# Patient Record
Sex: Female | Born: 1950 | Race: White | Hispanic: No | Marital: Married | Smoking: Never smoker
Health system: Southern US, Community
[De-identification: ages and names within clinical notes are randomized; demographics above are authoritative.]

## PROBLEM LIST (undated history)

## (undated) DIAGNOSIS — I10 Essential (primary) hypertension: Secondary | ICD-10-CM

## (undated) DIAGNOSIS — F419 Anxiety disorder, unspecified: Secondary | ICD-10-CM

## (undated) DIAGNOSIS — M1990 Unspecified osteoarthritis, unspecified site: Secondary | ICD-10-CM

## (undated) DIAGNOSIS — F329 Major depressive disorder, single episode, unspecified: Secondary | ICD-10-CM

## (undated) DIAGNOSIS — I6529 Occlusion and stenosis of unspecified carotid artery: Secondary | ICD-10-CM

## (undated) DIAGNOSIS — M6740 Ganglion, unspecified site: Secondary | ICD-10-CM

## (undated) DIAGNOSIS — Z8679 Personal history of other diseases of the circulatory system: Secondary | ICD-10-CM

## (undated) DIAGNOSIS — H269 Unspecified cataract: Secondary | ICD-10-CM

## (undated) DIAGNOSIS — R011 Cardiac murmur, unspecified: Secondary | ICD-10-CM

## (undated) DIAGNOSIS — E785 Hyperlipidemia, unspecified: Secondary | ICD-10-CM

## (undated) DIAGNOSIS — Q185 Microstomia: Secondary | ICD-10-CM

## (undated) DIAGNOSIS — F32A Depression, unspecified: Secondary | ICD-10-CM

## (undated) DIAGNOSIS — M19039 Primary osteoarthritis, unspecified wrist: Secondary | ICD-10-CM

## (undated) HISTORY — PX: BREAST BIOPSY: SHX20

## (undated) HISTORY — DX: Hyperlipidemia, unspecified: E78.5

## (undated) HISTORY — DX: Occlusion and stenosis of unspecified carotid artery: I65.29

## (undated) HISTORY — PX: ABDOMINAL HYSTERECTOMY: SHX81

---

## 2004-05-28 ENCOUNTER — Ambulatory Visit: Payer: Self-pay | Admitting: Unknown Physician Specialty

## 2005-03-19 ENCOUNTER — Ambulatory Visit: Payer: Self-pay | Admitting: General Practice

## 2006-03-23 ENCOUNTER — Ambulatory Visit: Payer: Self-pay | Admitting: Family Medicine

## 2007-03-29 ENCOUNTER — Ambulatory Visit: Payer: Self-pay | Admitting: Family Medicine

## 2007-04-01 ENCOUNTER — Ambulatory Visit: Payer: Self-pay | Admitting: Family Medicine

## 2007-04-12 ENCOUNTER — Other Ambulatory Visit: Payer: Self-pay

## 2007-04-12 ENCOUNTER — Ambulatory Visit: Payer: Self-pay | Admitting: Surgery

## 2007-04-15 ENCOUNTER — Ambulatory Visit: Payer: Self-pay | Admitting: Surgery

## 2008-04-11 ENCOUNTER — Ambulatory Visit: Payer: Self-pay | Admitting: Family Medicine

## 2009-04-16 ENCOUNTER — Ambulatory Visit: Payer: Self-pay | Admitting: Family Medicine

## 2009-08-02 ENCOUNTER — Ambulatory Visit: Payer: Self-pay | Admitting: Unknown Physician Specialty

## 2009-08-02 HISTORY — PX: COLONOSCOPY: SHX174

## 2009-11-18 ENCOUNTER — Other Ambulatory Visit: Admission: RE | Admit: 2009-11-18 | Discharge: 2009-11-18 | Payer: Self-pay | Admitting: Family Medicine

## 2010-03-11 ENCOUNTER — Ambulatory Visit (HOSPITAL_COMMUNITY): Admission: RE | Admit: 2010-03-11 | Discharge: 2010-03-12 | Payer: Self-pay | Admitting: Obstetrics and Gynecology

## 2010-03-11 ENCOUNTER — Encounter (INDEPENDENT_AMBULATORY_CARE_PROVIDER_SITE_OTHER): Payer: Self-pay | Admitting: Obstetrics and Gynecology

## 2010-03-11 HISTORY — PX: BLADDER SUSPENSION: SHX72

## 2010-03-11 HISTORY — PX: COLPORRHAPHY: SHX921

## 2010-03-11 HISTORY — PX: TOTAL VAGINAL HYSTERECTOMY: SHX2548

## 2010-03-11 HISTORY — PX: CYSTOSCOPY: SUR368

## 2010-04-23 ENCOUNTER — Ambulatory Visit: Payer: Self-pay

## 2010-10-03 LAB — BASIC METABOLIC PANEL
BUN: 11 mg/dL (ref 6–23)
CO2: 27 mEq/L (ref 19–32)
CO2: 28 mEq/L (ref 19–32)
Calcium: 8.4 mg/dL (ref 8.4–10.5)
Chloride: 104 mEq/L (ref 96–112)
Chloride: 98 mEq/L (ref 96–112)
Creatinine, Ser: 0.65 mg/dL (ref 0.4–1.2)
Creatinine, Ser: 0.78 mg/dL (ref 0.4–1.2)
GFR calc Af Amer: >60 mL/min (ref >60)
Glucose, Bld: 96 mg/dL (ref 70–99)
Glucose, Bld: 99 mg/dL (ref 70–99)
Potassium: 3.9 mEq/L (ref 3.5–5.1)

## 2010-10-03 LAB — CBC
HCT: 35.6 % — ABNORMAL LOW (ref 36.0–46.0)
Hemoglobin: 10.3 g/dL — ABNORMAL LOW (ref 12.0–15.0)
MCH: 32.4 pg (ref 26.0–34.0)
MCH: 32.7 pg (ref 26.0–34.0)
MCHC: 34.7 g/dL (ref 30.0–36.0)
MCHC: 34.9 g/dL (ref 30.0–36.0)
MCV: 93.5 fL (ref 78.0–100.0)
MCV: 93.7 fL (ref 78.0–100.0)
Platelets: 190 10*3/uL (ref 150–400)
Platelets: 233 10*3/uL (ref 150–400)
RBC: 3.16 MIL/uL — ABNORMAL LOW (ref 3.87–5.11)
RDW: 12.3 % (ref 11.5–15.5)

## 2011-06-01 ENCOUNTER — Ambulatory Visit: Payer: Self-pay

## 2011-06-04 ENCOUNTER — Ambulatory Visit: Payer: Self-pay

## 2011-07-29 ENCOUNTER — Ambulatory Visit: Payer: Self-pay

## 2012-06-07 ENCOUNTER — Ambulatory Visit: Payer: Self-pay | Admitting: Family Medicine

## 2012-06-10 ENCOUNTER — Ambulatory Visit: Payer: Self-pay | Admitting: Family Medicine

## 2013-01-17 DIAGNOSIS — M6740 Ganglion, unspecified site: Secondary | ICD-10-CM

## 2013-01-17 DIAGNOSIS — M19039 Primary osteoarthritis, unspecified wrist: Secondary | ICD-10-CM

## 2013-01-17 HISTORY — DX: Primary osteoarthritis, unspecified wrist: M19.039

## 2013-01-17 HISTORY — DX: Ganglion, unspecified site: M67.40

## 2013-01-31 ENCOUNTER — Other Ambulatory Visit: Payer: Self-pay | Admitting: Orthopedic Surgery

## 2013-02-07 ENCOUNTER — Encounter (HOSPITAL_BASED_OUTPATIENT_CLINIC_OR_DEPARTMENT_OTHER): Payer: Self-pay | Admitting: *Deleted

## 2013-02-07 NOTE — Pre-Procedure Instructions (Addendum)
To come for BMET and EKG Office note, any cardiac testing and EKG req. from [place] at [facility]

## 2013-02-10 ENCOUNTER — Encounter (HOSPITAL_BASED_OUTPATIENT_CLINIC_OR_DEPARTMENT_OTHER)
Admission: RE | Admit: 2013-02-10 | Discharge: 2013-02-10 | Disposition: A | Discharge status: Home / Self Care | Payer: BC Managed Care – PPO | Source: Ambulatory Visit | Attending: Orthopedic Surgery | Admitting: Orthopedic Surgery

## 2013-02-10 LAB — BASIC METABOLIC PANEL
CO2: 24 mEq/L (ref 19–32)
Calcium: 9.4 mg/dL (ref 8.4–10.5)
Creatinine, Ser: 0.8 mg/dL (ref 0.50–1.10)
GFR calc non Af Amer: 77 mL/min — ABNORMAL LOW (ref >90)
Glucose, Bld: 85 mg/dL (ref 70–99)
Sodium: 133 mEq/L — ABNORMAL LOW (ref 135–145)

## 2013-02-13 NOTE — H&P (Signed)
  Christina [name] is an 62 y.o. female.   Chief Complaint: c/o chronic and progressive pain ulnar aspect left wrist HPI: .  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  She has had increasing pain on the ulnar aspect of her right wrist, volar aspect, for the past three years.  She has no antecedent history of injury.   Past Medical History  Diagnosis Date  . Arthritis     hands/fingers  . Abnormally small mouth   . Depression   . Hypertension     started med. 01/26/2013  . History of rheumatic fever     as a teenager  . Heart murmur     due to rheumatic fever as a teenager; states no known problems  . 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 Mucoid cyst of joint 01/2013    right middle finger  . Cataract, immature     Past Surgical History  Procedure Laterality Date  . Total vaginal hysterectomy  03/11/2010  . Colporrhaphy  03/11/2010    posterior  . Bladder suspension  03/11/2010    Solyx sling  . Cystoscopy  03/11/2010  . Hammer toe surgery Left   . Breast biopsy Left     x 2 - both benign    History reviewed. No pertinent family history. Social History:  reports that she has never smoked. She has never used smokeless tobacco. She reports that she does not drink alcohol or use illicit drugs.  Allergies:  Allergies  Allergen Reactions  . Sulfa Antibiotics Hives    No prescriptions prior to admission    No results found for this or any previous visit (from the past 48 hour(s)).  No results found.   Pertinent items are noted in HPI.  Height 5' (1.524 m), weight 57.607 kg (127 lb).  General appearance: alert Head: Normocephalic, without obvious abnormality Neck: supple, symmetrical, trachea midline Resp: clear to auscultation bilaterally Cardio: regular rate and rhythm GI: normal findings: bowel sounds normal Extremities:.  Inspection of her wrist reveals swelling on the volar inner aspect of her  left wrist.  She is tender on palpation over the pisiform. She has pain with pisotriquetral grind.  She has full range of motion of her fingers in flexion at the MP and IP joints.  She has mild shouldering of her left thumb CMC joint, but no radiographic evidence of arthrosis.   With respect to her right long finger she has a small mucoid cyst that is soft and easily decompressed by direct pressure.  She has full range of motion of her IP joints.  She does not have significant Heberden's and Bouchard's nodes.   X-rays of her left wrist, four views, demonstrate advanced pisotriquetral arthrosis with bone-on-bone arthropathy.  Her carpus is otherwise well preserved.  Her thumb carpometacarpal joint is normal in appearance.     Pulses: 2+ and symmetric Skin: normal Neurologic: Grossly normal    Assessment/Plan Impression:  End stage pisotriquetral arthrosis left wrist  Plan: To the OR for excision pisiform left wrist.The procedure, risks,benefits and post-op course were discussed with the patient at length and they were in agreement with the plan.    DASNOIT,Nyna Chilton J 02/13/2013, 1:46 PM   H&P documentation: 02/14/2013  -History and Physical Reviewed  -Patient has been re-examined  -No change in the plan of care  Wyn Forster, MD

## 2013-02-14 ENCOUNTER — Encounter (HOSPITAL_BASED_OUTPATIENT_CLINIC_OR_DEPARTMENT_OTHER): Payer: Self-pay | Admitting: Orthopedic Surgery

## 2013-02-14 ENCOUNTER — Ambulatory Visit (HOSPITAL_BASED_OUTPATIENT_CLINIC_OR_DEPARTMENT_OTHER)
Admission: RE | Admit: 2013-02-14 | Discharge: 2013-02-14 | Disposition: A | Discharge status: Home / Self Care | Payer: BC Managed Care – PPO | Source: Ambulatory Visit | Attending: Orthopedic Surgery | Admitting: Orthopedic Surgery

## 2013-02-14 ENCOUNTER — Encounter (HOSPITAL_BASED_OUTPATIENT_CLINIC_OR_DEPARTMENT_OTHER)
Admission: RE | Disposition: A | Discharge status: Home / Self Care | Payer: Self-pay | Source: Ambulatory Visit | Attending: Orthopedic Surgery

## 2013-02-14 ENCOUNTER — Ambulatory Visit (HOSPITAL_BASED_OUTPATIENT_CLINIC_OR_DEPARTMENT_OTHER): Payer: BC Managed Care – PPO | Admitting: Anesthesiology

## 2013-02-14 ENCOUNTER — Encounter (HOSPITAL_BASED_OUTPATIENT_CLINIC_OR_DEPARTMENT_OTHER): Payer: Self-pay | Admitting: Anesthesiology

## 2013-02-14 DIAGNOSIS — Z87898 Personal history of other specified conditions: Secondary | ICD-10-CM | POA: Insufficient documentation

## 2013-02-14 DIAGNOSIS — F329 Major depressive disorder, single episode, unspecified: Secondary | ICD-10-CM | POA: Insufficient documentation

## 2013-02-14 DIAGNOSIS — Z882 Allergy status to sulfonamides status: Secondary | ICD-10-CM | POA: Insufficient documentation

## 2013-02-14 DIAGNOSIS — M19049 Primary osteoarthritis, unspecified hand: Secondary | ICD-10-CM | POA: Insufficient documentation

## 2013-02-14 DIAGNOSIS — I10 Essential (primary) hypertension: Secondary | ICD-10-CM | POA: Insufficient documentation

## 2013-02-14 DIAGNOSIS — R011 Cardiac murmur, unspecified: Secondary | ICD-10-CM | POA: Insufficient documentation

## 2013-02-14 DIAGNOSIS — D2110 Benign neoplasm of connective and other soft tissue of unspecified upper limb, including shoulder: Secondary | ICD-10-CM | POA: Insufficient documentation

## 2013-02-14 DIAGNOSIS — F3289 Other specified depressive episodes: Secondary | ICD-10-CM | POA: Insufficient documentation

## 2013-02-14 HISTORY — DX: Depression, unspecified: F32.A

## 2013-02-14 HISTORY — DX: Cardiac murmur, unspecified: R01.1

## 2013-02-14 HISTORY — DX: Major depressive disorder, single episode, unspecified: F32.9

## 2013-02-14 HISTORY — DX: Personal history of other diseases of the circulatory system: Z86.79

## 2013-02-14 HISTORY — PX: EXCISION METACARPAL MASS: SHX6372

## 2013-02-14 HISTORY — PX: STERIOD INJECTION: SHX5046

## 2013-02-14 HISTORY — DX: Essential (primary) hypertension: I10

## 2013-02-14 HISTORY — DX: Ganglion, unspecified site: M67.40

## 2013-02-14 HISTORY — DX: Primary osteoarthritis, unspecified wrist: M19.039

## 2013-02-14 HISTORY — DX: Unspecified osteoarthritis, unspecified site: M19.90

## 2013-02-14 HISTORY — DX: Unspecified cataract: H26.9

## 2013-02-14 HISTORY — DX: Microstomia: Q18.5

## 2013-02-14 SURGERY — EXCISION METACARPAL MASS
Anesthesia: General | Site: Wrist | Laterality: Right | Wound class: Clean

## 2013-02-14 MED ORDER — DEXAMETHASONE SODIUM PHOSPHATE 4 MG/ML IJ SOLN
INTRAMUSCULAR | Status: DC | PRN
  Administered 2013-02-14: 10 mg via INTRAVENOUS

## 2013-02-14 MED ORDER — ONDANSETRON HCL 4 MG/2ML IJ SOLN
INTRAMUSCULAR | Status: DC | PRN
  Administered 2013-02-14: 4 mg via INTRAVENOUS

## 2013-02-14 MED ORDER — LIDOCAINE HCL (CARDIAC) 20 MG/ML IV SOLN
INTRAVENOUS | Status: DC | PRN
  Administered 2013-02-14: 40 mg via INTRAVENOUS

## 2013-02-14 MED ORDER — PROPOFOL 10 MG/ML IV BOLUS
INTRAVENOUS | Status: DC | PRN
  Administered 2013-02-14: 150 mg via INTRAVENOUS

## 2013-02-14 MED ORDER — HYDROMORPHONE HCL 2 MG PO TABS: 2.0000 mg | ORAL_TABLET | ORAL | Status: DC | PRN

## 2013-02-14 MED ORDER — OXYCODONE HCL 5 MG PO TABS
5.0000 mg | ORAL_TABLET | Freq: Once | ORAL | Status: AC | PRN
  Administered 2013-02-14: 5 mg via ORAL

## 2013-02-14 MED ORDER — OXYCODONE HCL 5 MG/5ML PO SOLN: 5.0000 mg | Freq: Once | ORAL | Status: AC | PRN

## 2013-02-14 MED ORDER — LACTATED RINGERS IV SOLN
INTRAVENOUS | Status: DC
  Administered 2013-02-14: 10:00:00 via INTRAVENOUS

## 2013-02-14 MED ORDER — CHLORHEXIDINE GLUCONATE 4 % EX LIQD: 60.0000 mL | Freq: Once | CUTANEOUS | Status: DC

## 2013-02-14 MED ORDER — MIDAZOLAM HCL 2 MG/2ML IJ SOLN: 1.0000 mg | INTRAMUSCULAR | Status: DC | PRN

## 2013-02-14 MED ORDER — CEPHALEXIN 500 MG PO CAPS: 500.0000 mg | ORAL_CAPSULE | Freq: Three times a day (TID) | ORAL | Status: DC

## 2013-02-14 MED ORDER — MIDAZOLAM HCL 2 MG/ML PO SYRP: 12.0000 mg | ORAL_SOLUTION | Freq: Once | ORAL | Status: DC | PRN

## 2013-02-14 MED ORDER — METHYLPREDNISOLONE ACETATE 40 MG/ML IJ SUSP
INTRAMUSCULAR | Status: DC | PRN
  Administered 2013-02-14: 20 mg via INTRA_ARTICULAR

## 2013-02-14 MED ORDER — HYDROMORPHONE HCL PF 1 MG/ML IJ SOLN
0.2500 mg | INTRAMUSCULAR | Status: DC | PRN
  Administered 2013-02-14 (×2): 0.5 mg via INTRAVENOUS

## 2013-02-14 MED ORDER — FENTANYL CITRATE 0.05 MG/ML IJ SOLN: 50.0000 ug | INTRAMUSCULAR | Status: DC | PRN

## 2013-02-14 MED ORDER — FENTANYL CITRATE 0.05 MG/ML IJ SOLN
INTRAMUSCULAR | Status: DC | PRN
  Administered 2013-02-14 (×3): 25 ug via INTRAVENOUS

## 2013-02-14 SURGICAL SUPPLY — 57 items
BAG DECANTER FOR FLEXI CONT (MISCELLANEOUS) ×3 IMPLANT
BANDAGE ADHESIVE 1X3 (GAUZE/BANDAGES/DRESSINGS) ×3 IMPLANT
BANDAGE ELASTIC 3 VELCRO ST LF (GAUZE/BANDAGES/DRESSINGS) ×3 IMPLANT
BANDAGE ELASTIC 4 VELCRO ST LF (GAUZE/BANDAGES/DRESSINGS) ×3 IMPLANT
BANDAGE GAUZE ELAST BULKY 4 IN (GAUZE/BANDAGES/DRESSINGS) ×6 IMPLANT
BLADE MINI RND TIP GREEN BEAV (BLADE) ×3 IMPLANT
BLADE SURG 15 STRL LF DISP TIS (BLADE) ×2 IMPLANT
BLADE SURG 15 STRL SS (BLADE) ×1
BNDG ESMARK 4X9 LF (GAUZE/BANDAGES/DRESSINGS) ×3 IMPLANT
BRUSH SCRUB EZ PLAIN DRY (MISCELLANEOUS) ×3 IMPLANT
CANISTER SUCTION 1200CC (MISCELLANEOUS) IMPLANT
CLOTH BEACON ORANGE TIMEOUT ST (SAFETY) ×3 IMPLANT
CORDS BIPOLAR (ELECTRODE) ×3 IMPLANT
COVER MAYO STAND STRL (DRAPES) ×3 IMPLANT
COVER TABLE BACK 60X90 (DRAPES) ×3 IMPLANT
CUFF TOURNIQUET SINGLE 18IN (TOURNIQUET CUFF) ×3 IMPLANT
DECANTER SPIKE VIAL GLASS SM (MISCELLANEOUS) IMPLANT
DRAPE EXTREMITY T 121X128X90 (DRAPE) ×3 IMPLANT
DRAPE OEC MINIVIEW 54X84 (DRAPES) IMPLANT
DRAPE SURG 17X23 STRL (DRAPES) ×3 IMPLANT
GAUZE XEROFORM 1X8 LF (GAUZE/BANDAGES/DRESSINGS) ×3 IMPLANT
GLOVE BIO SURGEON STRL SZ 6.5 (GLOVE) ×3 IMPLANT
GLOVE BIOGEL M STRL SZ7.5 (GLOVE) IMPLANT
GLOVE BIOGEL PI IND STRL 7.0 (GLOVE) ×4 IMPLANT
GLOVE BIOGEL PI INDICATOR 7.0 (GLOVE) ×2
GLOVE ORTHO TXT STRL SZ7.5 (GLOVE) ×3 IMPLANT
GOWN BRE IMP PREV XXLGXLNG (GOWN DISPOSABLE) ×3 IMPLANT
GOWN PREVENTION PLUS XLARGE (GOWN DISPOSABLE) ×3 IMPLANT
LOOP VESSEL MAXI BLUE (MISCELLANEOUS) IMPLANT
NEEDLE 27GAX1X1/2 (NEEDLE) ×3 IMPLANT
NS IRRIG 1000ML POUR BTL (IV SOLUTION) ×3 IMPLANT
PACK BASIN DAY SURGERY FS (CUSTOM PROCEDURE TRAY) ×3 IMPLANT
PAD ALCOHOL SWAB (MISCELLANEOUS) ×3 IMPLANT
PAD CAST 3X4 CTTN HI CHSV (CAST SUPPLIES) ×4 IMPLANT
PADDING CAST ABS 4INX4YD NS (CAST SUPPLIES) ×1
PADDING CAST ABS COTTON 4X4 ST (CAST SUPPLIES) ×2 IMPLANT
PADDING CAST COTTON 3X4 STRL (CAST SUPPLIES) ×2
SLEEVE SCD COMPRESS KNEE MED (MISCELLANEOUS) ×3 IMPLANT
SPLINT PLASTER CAST XFAST 3X15 (CAST SUPPLIES) ×2 IMPLANT
SPLINT PLASTER XTRA FASTSET 3X (CAST SUPPLIES) ×1
SPONGE GAUZE 4X4 12PLY (GAUZE/BANDAGES/DRESSINGS) ×3 IMPLANT
STOCKINETTE 4X48 STRL (DRAPES) ×3 IMPLANT
STRIP CLOSURE SKIN 1/2X4 (GAUZE/BANDAGES/DRESSINGS) ×3 IMPLANT
SUCTION FRAZIER TIP 10 FR DISP (SUCTIONS) IMPLANT
SUT ETHIBOND 3-0 V-5 (SUTURE) IMPLANT
SUT PROLENE 3 0 PS 2 (SUTURE) ×3 IMPLANT
SUT VIC AB 2-0 PS2 27 (SUTURE) IMPLANT
SUT VIC AB 3-0 FS2 27 (SUTURE) IMPLANT
SWABSTICK POVIDONE IODINE SNGL (MISCELLANEOUS) ×3 IMPLANT
SYR 3ML 23GX1 SAFETY (SYRINGE) ×3 IMPLANT
SYR 3ML LL SCALE MARK (SYRINGE) ×3 IMPLANT
SYR BULB 3OZ (MISCELLANEOUS) ×3 IMPLANT
SYR CONTROL 10ML LL (SYRINGE) ×3 IMPLANT
TOWEL OR 17X24 6PK STRL BLUE (TOWEL DISPOSABLE) ×3 IMPLANT
TRAY DSU PREP LF (CUSTOM PROCEDURE TRAY) ×3 IMPLANT
TUBE CONNECTING 20X1/4 (TUBING) IMPLANT
UNDERPAD 30X30 INCONTINENT (UNDERPADS AND DIAPERS) ×3 IMPLANT

## 2013-02-14 NOTE — Anesthesia Procedure Notes (Signed)
Procedure Name: LMA Insertion Date/Time: 02/14/2013 10:29 AM Performed by: Gar Gibbon Pre-anesthesia Checklist: Patient identified, Emergency Drugs available, Suction available and Patient being monitored Patient Re-evaluated:Patient Re-evaluated prior to inductionOxygen Delivery Method: Circle System Utilized Preoxygenation: Pre-oxygenation with 100% oxygen Intubation Type: IV induction Ventilation: Mask ventilation without difficulty LMA: LMA inserted LMA Size: 3.0 Number of attempts: 1 Airway Equipment and Method: bite block Placement Confirmation: positive ETCO2 Tube secured with: Tape Dental Injury: Teeth and Oropharynx as per pre-operative assessment  Comments: #4 LMA placed, unable to seal. Replaced with #3

## 2013-02-14 NOTE — Op Note (Signed)
[id number] 

## 2013-02-14 NOTE — Transfer of Care (Signed)
Immediate Anesthesia Transfer of Care Note  Patient: Christina [name]  Procedure(s) Performed: Procedure(s): LEFT EXCISE PISIFORM (Left) INJECT RIGHT LONG DIP WITH CORTISONE (Right)  Patient Location: PACU  Anesthesia Type:General  Level of Consciousness: awake, sedated and patient cooperative  Airway & Oxygen Therapy: Patient Spontanous Breathing and Patient connected to face mask oxygen  Post-op Assessment: Report given to PACU RN and Post -op Vital signs reviewed and stable  Post vital signs: Reviewed and stable  Complications: No apparent anesthesia complications

## 2013-02-14 NOTE — Anesthesia Postprocedure Evaluation (Signed)
  Anesthesia Post-op Note  Patient: Christina [name]  Procedure(s) Performed: Procedure(s): LEFT EXCISE PISIFORM (Left) INJECT RIGHT LONG DIP WITH CORTISONE (Right)  Patient Location: PACU  Anesthesia Type:General  Level of Consciousness: awake, alert  and oriented  Airway and Oxygen Therapy: Patient Spontanous Breathing  Post-op Pain: mild  Post-op Assessment: Post-op Vital signs reviewed, [name] Cardiovascular Status Stable, Respiratory Function Stable, Patent Airway and No signs of Nausea or vomiting  Post-op Vital Signs: Reviewed and stable  Complications: No apparent anesthesia complications

## 2013-02-14 NOTE — Brief Op Note (Signed)
02/14/2013  11:39 AM  PATIENT:  Christina [name]  62 y.o. female  PRE-OPERATIVE DIAGNOSIS:  LEFT PISOTRIQUETRAL DJD, RIGHT LONG MUCOID CYST  POST-OPERATIVE DIAGNOSIS:  left pisotriquetral djd, right long mucoid cyst  PROCEDURE:  Procedure(s): LEFT EXCISE PISIFORM (Left) INJECT RIGHT LONG DIP WITH CORTISONE (Right)  SURGEON:  Surgeon(s) and Role:    * Wyn Forster., MD - Primary  PHYSICIAN ASSISTANT:   ASSISTANTS: Surgical technician  ANESTHESIA:   general  EBL:  Total I/O In: 450 [I.V.:450] Out: -   BLOOD ADMINISTERED:none  DRAINS: none   LOCAL MEDICATIONS USED:  XYLOCAINE   SPECIMEN:  No Specimen  DISPOSITION OF SPECIMEN:  N/A  COUNTS:  YES  TOURNIQUET:   Total Tourniquet Time Documented: Upper Arm (Left) - 31 minutes Total: Upper Arm (Left) - 31 minutes   DICTATION: .Other Dictation: Dictation Number [id number]  PLAN OF CARE: Discharge to home after PACU  PATIENT DISPOSITION:  PACU - hemodynamically stable.   Delay start of Pharmacological VTE agent (>24hrs) due to surgical blood loss or risk of bleeding: not applicable

## 2013-02-14 NOTE — Anesthesia Preprocedure Evaluation (Addendum)
Anesthesia Evaluation  Patient identified by MRN, date of birth, ID band Patient awake    Reviewed: Allergy & Precautions, H&P , NPO status , [name] Chart, lab work & pertinent test results  Airway Mallampati: II TM Distance: >3 FB Neck ROM: Full    Dental no notable dental hx. (+) Teeth Intact and Dental Advisory Given   Pulmonary neg pulmonary ROS,  breath sounds clear to auscultation  Pulmonary exam normal       Cardiovascular hypertension, On Medications + Valvular Problems/Murmurs Rhythm:Regular Rate:Normal     Neuro/Psych PSYCHIATRIC DISORDERS negative neurological ROS     GI/Hepatic negative GI ROS, Neg liver ROS,   Endo/Other  negative endocrine ROS  Renal/GU negative Renal ROS  negative genitourinary   Musculoskeletal   Abdominal   Peds  Hematology negative hematology ROS (+)   Anesthesia Other Findings   Reproductive/Obstetrics negative OB ROS                          Anesthesia Physical Anesthesia Plan  ASA: II  Anesthesia Plan: General   Post-op Pain Management:    Induction: Intravenous  Airway Management Planned: LMA  Additional Equipment:   Intra-op Plan:   Post-operative Plan: Extubation in OR  Informed Consent: I have reviewed the patients History and Physical, chart, labs and discussed the procedure including the risks, benefits and alternatives for the proposed anesthesia with the patient or authorized representative who has indicated his/her understanding and acceptance.   Dental advisory given  Plan Discussed with: CRNA  Anesthesia Plan Comments:         Anesthesia Quick Evaluation

## 2013-02-15 ENCOUNTER — Encounter (HOSPITAL_BASED_OUTPATIENT_CLINIC_OR_DEPARTMENT_OTHER): Payer: Self-pay | Admitting: Orthopedic Surgery

## 2013-02-15 NOTE — Op Note (Signed)
NAME:  Christina [name], Christina [name]               ACCOUNT NO.:  [id number]  MEDICAL RECORD NO.:  [id number]  LOCATION:                               FACILITY:  [facility]  PHYSICIAN:  Christina Fitch. Samya [name], M.D. DATE OF BIRTH:  Oct 22, 1950  DATE OF PROCEDURE:  02/14/2013 DATE OF DISCHARGE:  02/14/2013                              OPERATIVE REPORT   PREOPERATIVE DIAGNOSIS:  Painful pisotriquetral arthritis, left wrist. Also mucoid cyst, dorsal radial aspect right long finger.  POSTOPERATIVE DIAGNOSIS:  Painful pisotriquetral arthritis, left wrist. Also mucoid cyst, dorsal radial aspect right long finger.  PROCEDURE: 1. Resection of left pisiform and repair of flexor carpi ulnaris     tendon. 2. Injection of Depo-Medrol and 2% lidocaine into dorsal aspect of     right long finger distal interphalangeal joint.  SURGEON:  Christina Fitch. Amiya [name], M.D.  ASSISTANT:  Surgical technician.  ANESTHESIA:  General by LMA.  SUPERVISING ANESTHESIOLOGIST:  Christina Mayo, MD.  INDICATIONS:  Christina [name] is a 62 year old woman referred through the courtesy of Dr. Lupita [name], for evaluation and management of a painful left wrist.  Clinical examination revealed signs of advanced pisotriquetral arthrosis with marked deformity of the pisiform and bone- on-bone arthropathy.  She had a large osteophyte on the triquetrum.  She also had a mucoid cyst on the dorsal radial aspect of her right long finger at distal interphalangeal joint.  This had previously drained. We advised her that we might be able to help this resolve by an intra- articular steroid injection while she was under anesthesia.  Christina [name] requested that we treat the right long finger during her anesthesia for her left wrist pain predicament.  After informed consent, she was brought to the operating room at this time.  Preoperatively, she was interviewed by Dr. Sampson [name] who recommended general anesthesia by LMA technique.  DESCRIPTION OF PROCEDURE:   Christina [name] was brought to room 2 of the [name] and placed in supine position on the operating table.  Following the induction of general anesthesia by LMA technique, the left arm was prepped with Betadine soap and solution, sterilely draped.  A 2 g of Ancef was administered as an IV prophylactic antibiotic.  During the induction of anesthesia when Christina [name] was anesthetic, we prepped the right long finger with Betadine waited a few moments and then injected 0.5 mL of 50:50 mixture of Depo-Medrol 40 mg/mL and 2% plain lidocaine.  Excellent joint distention was achieved.  This was then dressed with a Band-Aid.  The left hand and arm were then exsanguinated with an Esmarch bandage and the arterial tourniquet on the proximal brachium inflated to 250 mmHg.  Curvilinear incision was fashioned between the glabrous skin and the dorsal skin on the ulnar aspect of the hand.  Subcutaneous tissues were carefully divided identifying the hypothenar muscles.  These were elevated off the distal pisiform, and the flexor carpi ulnaris was split on its ulnar aspect.  We shelled out the pisiform with great care using a towel clip to apply traction.  Care was taken to identify and protect the ulnar nerve throughout dissection.  There was a large shelf of reactive bone formation  on the triquetrum. The pisiform was removed en bloc.  This was placed in the cup to be provided to Christina [name] as a Scientist, forensic of surgery.  The pisotriquetral joint was inspected and synovectomy accomplished.  A very sizable rim of reactive osteophyte on the triquetrum was excised with a rongeur.  The flexor carpi ulnaris was repaired with figure-of-eight sutures of 3- 0 FiberWire knots inverted and the skin repaired with subcutaneous 4-0 Vicryl and intradermal 3-0 Prolene.  Christina [name] was placed in compressive dressing with a volar plaster splint maintaining the wrist in 10 degrees of dorsiflexion.  There were  no apparent complications.     Christina Fitch Aundra Espin, M.D.     RVS/MEDQ  D:  02/14/2013  T:  02/15/2013  Job:  [id number]

## 2013-06-13 ENCOUNTER — Ambulatory Visit: Payer: Self-pay | Admitting: Family Medicine

## 2014-08-09 ENCOUNTER — Ambulatory Visit: Payer: Self-pay | Admitting: Family Medicine

## 2014-08-09 DIAGNOSIS — Z1231 Encounter for screening mammogram for malignant neoplasm of breast: Secondary | ICD-10-CM | POA: Diagnosis not present

## 2014-09-15 DIAGNOSIS — Z0000 Encounter for general adult medical examination without abnormal findings: Secondary | ICD-10-CM | POA: Diagnosis not present

## 2014-10-18 ENCOUNTER — Other Ambulatory Visit: Payer: Self-pay | Admitting: Family Medicine

## 2014-10-18 DIAGNOSIS — R9389 Abnormal findings on diagnostic imaging of other specified body structures: Secondary | ICD-10-CM

## 2014-10-25 ENCOUNTER — Ambulatory Visit
Admission: RE | Admit: 2014-10-25 | Discharge: 2014-10-25 | Disposition: A | Discharge status: Home / Self Care | Payer: Medicare Other | Source: Ambulatory Visit | Attending: Family Medicine | Admitting: Family Medicine

## 2014-10-25 DIAGNOSIS — R9389 Abnormal findings on diagnostic imaging of other specified body structures: Secondary | ICD-10-CM

## 2014-10-25 DIAGNOSIS — I6523 Occlusion and stenosis of bilateral carotid arteries: Secondary | ICD-10-CM | POA: Diagnosis not present

## 2014-12-26 DIAGNOSIS — G479 Sleep disorder, unspecified: Secondary | ICD-10-CM | POA: Diagnosis not present

## 2014-12-26 DIAGNOSIS — F430 Acute stress reaction: Secondary | ICD-10-CM | POA: Diagnosis not present

## 2014-12-26 DIAGNOSIS — Z711 Person with feared health complaint in whom no diagnosis is made: Secondary | ICD-10-CM | POA: Diagnosis not present

## 2014-12-26 DIAGNOSIS — R32 Unspecified urinary incontinence: Secondary | ICD-10-CM | POA: Diagnosis not present

## 2015-01-03 ENCOUNTER — Encounter: Payer: Self-pay | Admitting: Neurology

## 2015-01-03 ENCOUNTER — Ambulatory Visit (INDEPENDENT_AMBULATORY_CARE_PROVIDER_SITE_OTHER): Payer: Medicare Other | Admitting: Neurology

## 2015-01-03 DIAGNOSIS — G4700 Insomnia, unspecified: Secondary | ICD-10-CM | POA: Diagnosis not present

## 2015-01-03 NOTE — Patient Instructions (Signed)
1. If you are interested in the driving assessment, you can contact [employer] in [place]. [phone]. 2. After this is complete, please let us know if you would like a referral for a neuropsych evaluation.
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
## 2015-01-03 NOTE — Progress Notes (Signed)
Note routed to Dr Arville Care.

## 2015-04-24 DIAGNOSIS — Z23 Encounter for immunization: Secondary | ICD-10-CM | POA: Diagnosis not present

## 2015-05-15 DIAGNOSIS — Z23 Encounter for immunization: Secondary | ICD-10-CM | POA: Diagnosis not present

## 2015-05-15 DIAGNOSIS — F322 Major depressive disorder, single episode, severe without psychotic features: Secondary | ICD-10-CM | POA: Diagnosis not present

## 2015-05-15 DIAGNOSIS — Z1211 Encounter for screening for malignant neoplasm of colon: Secondary | ICD-10-CM | POA: Diagnosis not present

## 2015-05-15 DIAGNOSIS — Z01419 Encounter for gynecological examination (general) (routine) without abnormal findings: Secondary | ICD-10-CM | POA: Diagnosis not present

## 2015-05-15 DIAGNOSIS — E7800 Pure hypercholesterolemia, unspecified: Secondary | ICD-10-CM | POA: Diagnosis not present

## 2015-05-15 DIAGNOSIS — I10 Essential (primary) hypertension: Secondary | ICD-10-CM | POA: Diagnosis not present

## 2015-05-15 DIAGNOSIS — Z0000 Encounter for general adult medical examination without abnormal findings: Secondary | ICD-10-CM | POA: Diagnosis not present

## 2015-05-16 ENCOUNTER — Other Ambulatory Visit: Payer: Self-pay | Admitting: Internal Medicine

## 2015-05-16 DIAGNOSIS — Z1239 Encounter for other screening for malignant neoplasm of breast: Secondary | ICD-10-CM

## 2015-05-17 ENCOUNTER — Other Ambulatory Visit: Payer: Self-pay | Admitting: Family Medicine

## 2015-05-17 DIAGNOSIS — Z13820 Encounter for screening for osteoporosis: Secondary | ICD-10-CM

## 2015-05-28 ENCOUNTER — Telehealth: Payer: Self-pay

## 2015-05-28 NOTE — Telephone Encounter (Signed)
Pt was referred by Serita Grammes , MD for a screening colonoscopy.  Pt has family hx of colon cancer. Her last colonoscopy was by Dr. Vira Agar at [facility] in 2011.  She does have constipation and has about 3 BM's weekly and the stool is usually very hard.  She has been scheduled an office visit with Walden Field, NP on 06/12/2015 at 8:30 AM.

## 2015-05-29 DIAGNOSIS — N179 Acute kidney failure, unspecified: Secondary | ICD-10-CM | POA: Diagnosis not present

## 2015-05-29 DIAGNOSIS — N183 Chronic kidney disease, stage 3 (moderate): Secondary | ICD-10-CM | POA: Diagnosis not present

## 2015-06-04 ENCOUNTER — Ambulatory Visit
Admission: RE | Admit: 2015-06-04 | Discharge: 2015-06-04 | Disposition: A | Discharge status: Home / Self Care | Payer: Medicare Other | Source: Ambulatory Visit | Attending: Family Medicine | Admitting: Family Medicine

## 2015-06-04 DIAGNOSIS — Z780 Asymptomatic menopausal state: Secondary | ICD-10-CM | POA: Insufficient documentation

## 2015-06-04 DIAGNOSIS — M85832 Other specified disorders of bone density and structure, left forearm: Secondary | ICD-10-CM | POA: Diagnosis not present

## 2015-06-04 DIAGNOSIS — Z13820 Encounter for screening for osteoporosis: Secondary | ICD-10-CM | POA: Insufficient documentation

## 2015-06-08 DIAGNOSIS — H6502 Acute serous otitis media, left ear: Secondary | ICD-10-CM | POA: Diagnosis not present

## 2015-06-12 ENCOUNTER — Other Ambulatory Visit: Payer: Self-pay

## 2015-06-12 ENCOUNTER — Encounter: Payer: Self-pay | Admitting: Nurse Practitioner

## 2015-06-12 ENCOUNTER — Ambulatory Visit (INDEPENDENT_AMBULATORY_CARE_PROVIDER_SITE_OTHER): Payer: Medicare Other | Admitting: Nurse Practitioner

## 2015-06-12 VITALS — BP 130/71 | HR 61 | Temp 97.1°F | Ht 59.0 in | Wt 125.4 lb

## 2015-06-12 DIAGNOSIS — Z800 Family history of malignant neoplasm of digestive organs: Secondary | ICD-10-CM

## 2015-06-12 DIAGNOSIS — K5900 Constipation, unspecified: Secondary | ICD-10-CM

## 2015-06-12 MED ORDER — PEG 3350-KCL-NA BICARB-NACL 420 G PO SOLR: 4000.0000 mL | ORAL | Status: DC

## 2015-06-12 NOTE — Progress Notes (Signed)
CC'ED TO PCP 

## 2015-06-12 NOTE — Assessment & Plan Note (Signed)
Issue with a family history of colon cancer including a paternal aunt who is diagnosed at age 65 and passed away at age 60 of colon cancer. She is been quite diligent and receiving her every 5 year colonoscopies. Last colonoscopy in January 2011 which is normal recommend 5 year repeat exam. We'll proceed with surveillance colonoscopy at this time for high risk family history.  Proceed with TCS with 12.5 pre-procedure Phenergan with Dr. Gala Romney in near future: the risks, benefits, and alternatives have been discussed with the patient in detail. The patient states understanding and desires to proceed.  The patient is not on any anticoagulants, chronic pain medications, or anxiolytics. She is on Paxil 40 mg daily. We'll provide for 12.5 mg preprocedure Phenergan to ensure adequate sedation.

## 2015-06-12 NOTE — Assessment & Plan Note (Signed)
Patient with mild intermittent constipation. Has a bowel movement about 3 times a week but the stools are hard and require straining. No hematochezia or noted. We'll recommend her she take Colace daily over-the-counter stool softener, increase her fiber and water in her diet, and can take MiraLAX 17 g daily as needed for constipation. Return for follow-up as needed.

## 2015-06-12 NOTE — Patient Instructions (Signed)
1. We'll schedule your procedure for you. 2. Further recommendations to be based on the results of your procedure. 3. To help with your constipation take a daily Colace (over-the-counter stool softener), eat a high-fiber diet, drink plenty of water. You can also take MiraLAX 17 g (1 capful) every day as needed for constipation. 4. Return for follow-up as needed for any new or worsening signs or symptoms.

## 2015-06-12 NOTE — Progress Notes (Signed)
Primary Care Physician:  [name], MD Primary Gastroenterologist:  Dr. Gala Romney   Chief Complaint  Patient presents with  . set up TCS  . Constipation    HPI:   64 year old female presents on referral from PCP for surveillance colonoscopy. PCP notes reviewed. [name] last colonoscopy was on 08/02/2009 and recommended every 5 years due to family history of colon cancer. She was due in January of this year. Reviewed report for colonoscopy completed at [facility] on 08/02/2009 which found internal hemorrhoids, no polyps seen. Recommended repeat exam in 5 years.  Today she states she's doing well. Denies abdominal pain, N/V, hematochezia, melena, unintentional weight loss, fever, chills. Tends to be constipated, BM 3 times a week with hard stools and straining. Not taking anything for this currently. Denies chest pain, dyspnea, dizziness, lightheadedness, syncope, near syncope. Denies any other upper or lower GI symptoms.  Past Medical History  Diagnosis Date  . Arthritis     hands/fingers  . Abnormally small mouth   . Depression   . Hypertension     started med. 01/26/2013  . History of rheumatic fever     as a teenager  . Heart murmur     due to rheumatic fever as a teenager; states no known problems  . 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 Mucoid cyst of joint 01/2013    right middle finger  . Cataract, immature   . Hyperlipidemia   . Carotid stenosis     Past Surgical History  Procedure Laterality Date  . Total vaginal hysterectomy  03/11/2010  . Colporrhaphy  03/11/2010    posterior  . Bladder suspension  03/11/2010    Solyx sling  . Cystoscopy  03/11/2010  . Breast biopsy Left     x 2 - both benign  . Excision metacarpal mass Left 02/14/2013    Procedure: LEFT EXCISE PISIFORM;  Surgeon: Cammie Sickle., MD;  Location: [place];  Service: Orthopedics;  Laterality: Left;  . Steriod  injection Right 02/14/2013    Procedure: INJECT RIGHT LONG DIP WITH CORTISONE;  Surgeon: Cammie Sickle., MD;  Location: [place];  Service: Orthopedics;  Laterality: Right;    Current Outpatient Prescriptions  Medication Sig Dispense Refill  . aspirin 81 MG tablet Take 81 mg by mouth daily.    . calcium carbonate (OS-CAL) 600 MG TABS Take 600 mg by mouth daily with breakfast.     . fish oil-omega-3 fatty acids 1000 MG capsule Take 2 g by mouth daily.    Marland Kitchen lisinopril (PRINIVIL,ZESTRIL) 10 MG tablet Take 10 mg by mouth daily.    . Multiple Vitamin (MULTIVITAMIN) tablet Take 1 tablet by mouth daily.    . naproxen sodium (ANAPROX) 220 MG tablet Take 220 mg by mouth 2 (two) times daily with a meal.    . PARoxetine (PAXIL) 40 MG tablet Take 40 mg by mouth every morning.    . simvastatin (ZOCOR) 10 MG tablet Take 20 mg by mouth at bedtime.      No current facility-administered medications for this visit.    Allergies as of 06/12/2015 - Review Complete 06/12/2015  Allergen Reaction Noted  . Sulfa antibiotics Hives 02/07/2013    No family history on file.  Social History   Social History  . Marital Status: Married    Spouse Name: N/A  . Number of Children: N/A  . Years of Education:  N/A   Occupational History  . Not on file.   Social History Main Topics  . Smoking status: Never Smoker   . Smokeless tobacco: Never Used  . Alcohol Use: 0.0 oz/week    0 Standard drinks or equivalent per week     Comment: once a month  . Drug Use: No  . Sexual Activity: Not on file   Other Topics Concern  . Not on file   Social History Narrative    Review of Systems: General: Negative for anorexia, weight loss, fever, chills, fatigue, weakness. Eyes: Negative for vision changes.  ENT: Negative for hoarseness, difficulty swallowing. CV: Negative for chest pain, angina, palpitations, peripheral edema.  Respiratory: Negative for dyspnea at rest, cough, sputum, wheezing.    GI: See history of present illness. Derm: Negative for rash or itching.  Endo: Negative for unusual weight change.  Heme: Negative for bruising or bleeding. Allergy: Negative for rash or hives.    Physical Exam: BP 130/71 mmHg  Pulse 61  Temp(Src) 97.1 F (36.2 C)  Ht 4\' 11"  (1.499 m)  Wt 125 lb 6.4 oz (56.881 kg)  BMI 25.31 kg/m2 General:   Alert and oriented. Pleasant and cooperative. Well-nourished and well-developed.  Head:  Normocephalic and atraumatic. Eyes:  Without icterus, sclera clear and conjunctiva pink.  Ears:  Normal auditory acuity. Cardiovascular:  S1, S2 present without murmurs appreciated. Extremities without clubbing or edema. Respiratory:  Clear to auscultation bilaterally. No wheezes, rales, or rhonchi. No distress.  Gastrointestinal:  +BS, soft, non-tender and non-distended. No HSM noted. No guarding or rebound. No masses appreciated.  Rectal:  Deferred  Neurologic:  Alert and oriented x4;  grossly normal neurologically. Psych:  Alert and cooperative. Normal mood and affect. Heme/Lymph/Immune: No excessive bruising noted.    06/12/2015 8:33 AM

## 2015-06-17 DIAGNOSIS — N183 Chronic kidney disease, stage 3 (moderate): Secondary | ICD-10-CM | POA: Diagnosis not present

## 2015-06-17 DIAGNOSIS — N179 Acute kidney failure, unspecified: Secondary | ICD-10-CM | POA: Diagnosis not present

## 2015-06-20 ENCOUNTER — Ambulatory Visit (HOSPITAL_COMMUNITY)
Admission: RE | Admit: 2015-06-20 | Discharge: 2015-06-20 | Disposition: A | Discharge status: Home / Self Care | Payer: Medicare Other | Source: Ambulatory Visit | Attending: Internal Medicine | Admitting: Internal Medicine

## 2015-06-20 ENCOUNTER — Encounter (HOSPITAL_COMMUNITY): Payer: Self-pay | Admitting: *Deleted

## 2015-06-20 ENCOUNTER — Encounter (HOSPITAL_COMMUNITY)
Admission: RE | Disposition: A | Discharge status: Home / Self Care | Payer: Self-pay | Source: Ambulatory Visit | Attending: Internal Medicine

## 2015-06-20 DIAGNOSIS — M1990 Unspecified osteoarthritis, unspecified site: Secondary | ICD-10-CM | POA: Diagnosis not present

## 2015-06-20 DIAGNOSIS — F329 Major depressive disorder, single episode, unspecified: Secondary | ICD-10-CM | POA: Diagnosis not present

## 2015-06-20 DIAGNOSIS — Z1211 Encounter for screening for malignant neoplasm of colon: Secondary | ICD-10-CM | POA: Insufficient documentation

## 2015-06-20 DIAGNOSIS — I10 Essential (primary) hypertension: Secondary | ICD-10-CM | POA: Insufficient documentation

## 2015-06-20 DIAGNOSIS — K635 Polyp of colon: Secondary | ICD-10-CM | POA: Diagnosis not present

## 2015-06-20 DIAGNOSIS — R011 Cardiac murmur, unspecified: Secondary | ICD-10-CM | POA: Insufficient documentation

## 2015-06-20 DIAGNOSIS — Z860100 Personal history of colon polyps, unspecified: Secondary | ICD-10-CM | POA: Insufficient documentation

## 2015-06-20 DIAGNOSIS — K5900 Constipation, unspecified: Secondary | ICD-10-CM | POA: Diagnosis not present

## 2015-06-20 DIAGNOSIS — E785 Hyperlipidemia, unspecified: Secondary | ICD-10-CM | POA: Insufficient documentation

## 2015-06-20 DIAGNOSIS — Z800 Family history of malignant neoplasm of digestive organs: Secondary | ICD-10-CM | POA: Diagnosis not present

## 2015-06-20 DIAGNOSIS — D120 Benign neoplasm of cecum: Secondary | ICD-10-CM | POA: Insufficient documentation

## 2015-06-20 DIAGNOSIS — Z79899 Other long term (current) drug therapy: Secondary | ICD-10-CM | POA: Diagnosis not present

## 2015-06-20 DIAGNOSIS — Z7982 Long term (current) use of aspirin: Secondary | ICD-10-CM | POA: Diagnosis not present

## 2015-06-20 HISTORY — PX: COLONOSCOPY: SHX5424

## 2015-06-20 HISTORY — DX: Anxiety disorder, unspecified: F41.9

## 2015-06-20 SURGERY — COLONOSCOPY
Anesthesia: Moderate Sedation

## 2015-06-20 MED ORDER — MEPERIDINE HCL 100 MG/ML IJ SOLN
INTRAMUSCULAR | Status: DC | PRN
  Administered 2015-06-20: 25 mg via INTRAVENOUS
  Administered 2015-06-20: 50 mg via INTRAVENOUS

## 2015-06-20 MED ORDER — MIDAZOLAM HCL 5 MG/5ML IJ SOLN
INTRAMUSCULAR | Status: AC
  Filled 2015-06-20: qty 10

## 2015-06-20 MED ORDER — PROMETHAZINE HCL 25 MG/ML IJ SOLN
INTRAMUSCULAR | Status: AC
  Administered 2015-06-20: 12.5 mg via INTRAVENOUS
  Filled 2015-06-20: qty 1

## 2015-06-20 MED ORDER — MEPERIDINE HCL 100 MG/ML IJ SOLN
INTRAMUSCULAR | Status: AC
  Filled 2015-06-20: qty 2

## 2015-06-20 MED ORDER — MIDAZOLAM HCL 5 MG/5ML IJ SOLN
INTRAMUSCULAR | Status: DC | PRN
  Administered 2015-06-20 (×2): 1 mg via INTRAVENOUS
  Administered 2015-06-20: 2 mg via INTRAVENOUS

## 2015-06-20 MED ORDER — ONDANSETRON HCL 4 MG/2ML IJ SOLN
INTRAMUSCULAR | Status: AC
  Filled 2015-06-20: qty 2

## 2015-06-20 MED ORDER — PROMETHAZINE HCL 25 MG/ML IJ SOLN
12.5000 mg | Freq: Once | INTRAMUSCULAR | Status: AC
  Administered 2015-06-20: 12.5 mg via INTRAVENOUS

## 2015-06-20 MED ORDER — STERILE WATER FOR IRRIGATION IR SOLN
Status: DC | PRN
  Administered 2015-06-20: 08:00:00

## 2015-06-20 MED ORDER — SODIUM CHLORIDE 0.9 % IV SOLN
INTRAVENOUS | Status: DC
  Administered 2015-06-20: 1000 mL via INTRAVENOUS

## 2015-06-20 MED ORDER — ONDANSETRON HCL 4 MG/2ML IJ SOLN
INTRAMUSCULAR | Status: DC | PRN
  Administered 2015-06-20: 4 mg via INTRAVENOUS

## 2015-06-20 MED ORDER — SODIUM CHLORIDE 0.9 % IJ SOLN
INTRAMUSCULAR | Status: DC
Start: 2015-06-20 — End: 2015-06-20
  Filled 2015-06-20: qty 3

## 2015-06-20 NOTE — Discharge Instructions (Addendum)
Colon Polyps Polyps are lumps of extra tissue growing inside the body. Polyps can grow in the large intestine (colon). Most colon polyps are noncancerous (benign). However, some colon polyps can become cancerous over time. Polyps that are larger than a pea may be harmful. To be safe, caregivers remove and test all polyps. CAUSES  Polyps form when mutations in the genes cause your cells to grow and divide even though no more tissue is needed. RISK FACTORS There are a number of risk factors that can increase your chances of getting colon polyps. They include:  Being older than 50 years.  Family history of colon polyps or colon cancer.  Long-term colon diseases, such as colitis or Crohn disease.  Being overweight.  Smoking.  Being inactive.  Drinking too much alcohol. SYMPTOMS  Most small polyps do not cause symptoms. If symptoms are present, they may include:  Blood in the stool. The stool may look dark red or black.  Constipation or diarrhea that lasts longer than 1 week. DIAGNOSIS People often do not know they have polyps until their caregiver finds them during a regular checkup. Your caregiver can use 4 tests to check for polyps:  Digital rectal exam. The caregiver wears gloves and feels inside the rectum. This test would find polyps only in the rectum.  Barium enema. The caregiver puts a liquid called barium into your rectum before taking X-rays of your colon. Barium makes your colon look white. Polyps are dark, so they are easy to see in the X-ray pictures.  Sigmoidoscopy. A thin, flexible tube (sigmoidoscope) is placed into your rectum. The sigmoidoscope has a light and tiny camera in it. The caregiver uses the sigmoidoscope to look at the last third of your colon.  Colonoscopy. This test is like sigmoidoscopy, but the caregiver looks at the entire colon. This is the most common method for finding and removing polyps. TREATMENT  Any polyps will be removed during a  sigmoidoscopy or colonoscopy. The polyps are then tested for cancer. PREVENTION  To help lower your risk of getting more colon polyps:  Eat plenty of fruits and vegetables. Avoid eating fatty foods.  Do not smoke.  Avoid drinking alcohol.  Exercise every day.  Lose weight if recommended by your caregiver.  Eat plenty of calcium and folate. Foods that are rich in calcium include milk, cheese, and broccoli. Foods that are rich in folate include chickpeas, kidney beans, and spinach. HOME CARE INSTRUCTIONS Keep all follow-up appointments as directed by your caregiver. You may need periodic exams to check for polyps. SEEK MEDICAL CARE IF: You notice bleeding during a bowel movement.   This information is not intended to replace advice given to you by your health care provider. Make sure you discuss any questions you have with your health care provider.   Document Released: 04/01/2004 Document Revised: 07/27/2014 Document Reviewed: 09/15/2011 Elsevier Interactive Patient Education 2016 Elsevier Inc[employer]  Colonoscopy Discharge Instructions  Read the instructions outlined below and refer to this sheet in the next few weeks. These discharge instructions provide you with general information on caring for yourself after you leave the hospital. Your doctor may also give you specific instructions. While your treatment has been planned according to the most current medical practices available, unavoidable complications occasionally occur. If you have any problems or questions after discharge, call Dr. Gala Romney at [phone]. ACTIVITY  You may resume your regular activity, but move at a slower pace for the next 24 hours.   Take frequent rest  periods for the next 24 hours.   Walking will help get rid of the air and reduce the bloated feeling in your belly (abdomen).   No driving for 24 hours (because of the medicine (anesthesia) used during the test).    Do not sign any important legal documents or  operate any machinery for 24 hours (because of the anesthesia used during the test).  NUTRITION  Drink plenty of fluids.   You may resume your normal diet as instructed by your doctor.   Begin with a light meal and progress to your normal diet. Heavy or fried foods are harder to digest and may make you feel sick to your stomach (nauseated).   Avoid alcoholic beverages for 24 hours or as instructed.  MEDICATIONS  You may resume your normal medications unless your doctor tells you otherwise.  WHAT YOU CAN EXPECT TODAY  Some feelings of bloating in the abdomen.   Passage of more gas than usual.   Spotting of blood in your stool or on the toilet paper.  IF YOU HAD POLYPS REMOVED DURING THE COLONOSCOPY:  No aspirin products for 7 days or as instructed.   No alcohol for 7 days or as instructed.   Eat a soft diet for the next 24 hours.  FINDING OUT THE RESULTS OF YOUR TEST Not all test results are available during your visit. If your test results are not back during the visit, make an appointment with your caregiver to find out the results. Do not assume everything is normal if you have not heard from your caregiver or the medical facility. It is important for you to follow up on all of your test results.  SEEK IMMEDIATE MEDICAL ATTENTION IF:  You have more than a spotting of blood in your stool.   Your belly is swollen (abdominal distention).   You are nauseated or vomiting.   You have a temperature over 101.   You have abdominal pain or discomfort that is severe or gets worse throughout the day.    Colon polyp information provided  Further recommendations to follow pending review of pathology report

## 2015-06-20 NOTE — H&P (View-Only) (Signed)
Primary Care Physician:  [name], MD Primary Gastroenterologist:  Dr. Gala Romney   Chief Complaint  Patient presents with  . set up TCS  . Constipation    HPI:   64 year old female presents on referral from PCP for surveillance colonoscopy. PCP notes reviewed. [name] last colonoscopy was on 08/02/2009 and recommended every 5 years due to family history of colon cancer. She was due in January of this year. Reviewed report for colonoscopy completed at [facility] on 08/02/2009 which found internal hemorrhoids, no polyps seen. Recommended repeat exam in 5 years.  Today she states she's doing well. Denies abdominal pain, N/V, hematochezia, melena, unintentional weight loss, fever, chills. Tends to be constipated, BM 3 times a week with hard stools and straining. Not taking anything for this currently. Denies chest pain, dyspnea, dizziness, lightheadedness, syncope, near syncope. Denies any other upper or lower GI symptoms.  Past Medical History  Diagnosis Date  . Arthritis     hands/fingers  . Abnormally small mouth   . Depression   . Hypertension     started med. 01/26/2013  . History of rheumatic fever     as a teenager  . Heart murmur     due to rheumatic fever as a teenager; states no known problems  . 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 Mucoid cyst of joint 01/2013    right middle finger  . Cataract, immature   . Hyperlipidemia   . Carotid stenosis     Past Surgical History  Procedure Laterality Date  . Total vaginal hysterectomy  03/11/2010  . Colporrhaphy  03/11/2010    posterior  . Bladder suspension  03/11/2010    Solyx sling  . Cystoscopy  03/11/2010  . Breast biopsy Left     x 2 - both benign  . Excision metacarpal mass Left 02/14/2013    Procedure: LEFT EXCISE PISIFORM;  Surgeon: Cammie Sickle., MD;  Location: [place];  Service: Orthopedics;  Laterality: Left;  . Steriod  injection Right 02/14/2013    Procedure: INJECT RIGHT LONG DIP WITH CORTISONE;  Surgeon: Cammie Sickle., MD;  Location: [place];  Service: Orthopedics;  Laterality: Right;    Current Outpatient Prescriptions  Medication Sig Dispense Refill  . aspirin 81 MG tablet Take 81 mg by mouth daily.    . calcium carbonate (OS-CAL) 600 MG TABS Take 600 mg by mouth daily with breakfast.     . fish oil-omega-3 fatty acids 1000 MG capsule Take 2 g by mouth daily.    Marland Kitchen lisinopril (PRINIVIL,ZESTRIL) 10 MG tablet Take 10 mg by mouth daily.    . Multiple Vitamin (MULTIVITAMIN) tablet Take 1 tablet by mouth daily.    . naproxen sodium (ANAPROX) 220 MG tablet Take 220 mg by mouth 2 (two) times daily with a meal.    . PARoxetine (PAXIL) 40 MG tablet Take 40 mg by mouth every morning.    . simvastatin (ZOCOR) 10 MG tablet Take 20 mg by mouth at bedtime.      No current facility-administered medications for this visit.    Allergies as of 06/12/2015 - Review Complete 06/12/2015  Allergen Reaction Noted  . Sulfa antibiotics Hives 02/07/2013    No family history on file.  Social History   Social History  . Marital Status: Married    Spouse Name: N/A  . Number of Children: N/A  . Years of Education:  N/A   Occupational History  . Not on file.   Social History Main Topics  . Smoking status: Never Smoker   . Smokeless tobacco: Never Used  . Alcohol Use: 0.0 oz/week    0 Standard drinks or equivalent per week     Comment: once a month  . Drug Use: No  . Sexual Activity: Not on file   Other Topics Concern  . Not on file   Social History Narrative    Review of Systems: General: Negative for anorexia, weight loss, fever, chills, fatigue, weakness. Eyes: Negative for vision changes.  ENT: Negative for hoarseness, difficulty swallowing. CV: Negative for chest pain, angina, palpitations, peripheral edema.  Respiratory: Negative for dyspnea at rest, cough, sputum, wheezing.    GI: See history of present illness. Derm: Negative for rash or itching.  Endo: Negative for unusual weight change.  Heme: Negative for bruising or bleeding. Allergy: Negative for rash or hives.    Physical Exam: BP 130/71 mmHg  Pulse 61  Temp(Src) 97.1 F (36.2 C)  Ht 4\' 11"  (1.499 m)  Wt 125 lb 6.4 oz (56.881 kg)  BMI 25.31 kg/m2 General:   Alert and oriented. Pleasant and cooperative. Well-nourished and well-developed.  Head:  Normocephalic and atraumatic. Eyes:  Without icterus, sclera clear and conjunctiva pink.  Ears:  Normal auditory acuity. Cardiovascular:  S1, S2 present without murmurs appreciated. Extremities without clubbing or edema. Respiratory:  Clear to auscultation bilaterally. No wheezes, rales, or rhonchi. No distress.  Gastrointestinal:  +BS, soft, non-tender and non-distended. No HSM noted. No guarding or rebound. No masses appreciated.  Rectal:  Deferred  Neurologic:  Alert and oriented x4;  grossly normal neurologically. Psych:  Alert and cooperative. Normal mood and affect. Heme/Lymph/Immune: No excessive bruising noted.    06/12/2015 8:33 AM

## 2015-06-20 NOTE — Interval H&P Note (Signed)
History and Physical Interval Note:  06/20/2015 7:39 AM  Christina [name]  has presented today for surgery, with the diagnosis of family history of colon cancer  The various methods of treatment have been discussed with the patient and family. After consideration of risks, benefits and other options for treatment, the patient has consented to  Procedure(s) with comments: COLONOSCOPY (N/A) - 730 as a surgical intervention .  The patient's history has been reviewed, patient examined, no change in status, stable for surgery.  I have reviewed the patient's chart and labs.  Questions were answered to the patient's satisfaction.     Robert Rourk  No change. High-risk screening colonoscopy per plan.The risks, benefits, limitations, alternatives and imponderables have been reviewed with the patient. Questions have been answered. All parties are agreeable.

## 2015-06-20 NOTE — Op Note (Signed)
[facility] [address] [place], [address]   COLONOSCOPY PROCEDURE REPORT  PATIENT: Christina [name], Christina [name]  MR#: [id number] BIRTHDATE: 04-Dec-1950 , 19  yrs. old GENDER: female ENDOSCOPIST: R.  Garfield Cornea, MD FACP [facility] REFERRED [id number] Brigitte Pulse, M.D. PROCEDURE DATE:  July 13, 2015 PROCEDURE:   Colonoscopy with snare polypectomy INDICATIONS:High-risk screening colonoscopy. MEDICATIONS: Versed 4 mg IV and Demerol 75 mg IV.  Phenergan 12.5 mg IV.  Zofran 4 mg IV. ASA CLASS:       Class II  CONSENT: The risks, benefits, alternatives and imponderables including but not limited to bleeding, perforation as well as the possibility of a missed lesion have been reviewed.  The potential for biopsy, lesion removal, etc. have also been discussed. Questions have been answered.  All parties agreeable.  Please see the history and physical in the medical record for more information.  DESCRIPTION OF PROCEDURE:   After the risks benefits and alternatives of the procedure were thoroughly explained, informed consent was obtained.  The digital rectal exam revealed no abnormalities of the rectum.   The [id number]  endoscope was introduced through the anus and advanced to the cecum, which was identified by both the appendix and ileocecal valve. No adverse events experienced.   The quality of the prep was adequate  The instrument was then slowly withdrawn as the colon was fully examined. Estimated blood loss is zero unless otherwise noted in this procedure report.      COLON FINDINGS: Normal-appearing rectum.  Rectal vault small. Unable to retroflex.  Rectal mucosa seen well on?"face and appeared normal.  The patient had (1) 5 mm polyp at the ileocecal valve; otherwise, the remainder of the colonic mucosa appeared normal.  The above-mentioned polyp was cold snare removed.  Retroflexion was not performed. .  Withdrawal time=13 minutes 0 seconds.  The scope was withdrawn  and the procedure completed. COMPLICATIONS: There were no immediate complications. EBL 3 mL ENDOSCOPIC IMPRESSION: Single colonic polyp?"removed as described above  RECOMMENDATIONS: Follow-up on pathology.  eSigned:  R. Garfield Cornea, MD [facility] 07-13-15 8:26 AM   cc:  CPT CODES: ICD CODES:  The ICD and CPT codes recommended by this software are interpretations from the data that the clinical staff has captured with the software.  The verification of the translation of this report to the ICD and CPT codes and modifiers is the sole responsibility of the health care institution and practicing physician where this report was generated.  [employer]. will not be held responsible for the validity of the ICD and CPT codes included on this report.  AMA assumes no liability for data contained or not contained herein. CPT is a Designer, television/film set of the [employer].

## 2015-06-23 ENCOUNTER — Encounter: Payer: Self-pay | Admitting: Internal Medicine

## 2015-06-24 ENCOUNTER — Encounter (HOSPITAL_COMMUNITY): Payer: Self-pay | Admitting: Internal Medicine

## 2015-09-24 ENCOUNTER — Other Ambulatory Visit: Payer: Self-pay | Admitting: Family Medicine

## 2015-09-24 DIAGNOSIS — Z1231 Encounter for screening mammogram for malignant neoplasm of breast: Secondary | ICD-10-CM

## 2015-09-26 ENCOUNTER — Other Ambulatory Visit: Payer: Self-pay | Admitting: Family Medicine

## 2015-09-26 ENCOUNTER — Ambulatory Visit
Admission: RE | Admit: 2015-09-26 | Discharge: 2015-09-26 | Disposition: A | Discharge status: Home / Self Care | Payer: Medicare Other | Source: Ambulatory Visit | Attending: Family Medicine | Admitting: Family Medicine

## 2015-09-26 DIAGNOSIS — Z1231 Encounter for screening mammogram for malignant neoplasm of breast: Secondary | ICD-10-CM

## 2015-11-14 DIAGNOSIS — F322 Major depressive disorder, single episode, severe without psychotic features: Secondary | ICD-10-CM | POA: Diagnosis not present

## 2015-11-14 DIAGNOSIS — E7800 Pure hypercholesterolemia, unspecified: Secondary | ICD-10-CM | POA: Diagnosis not present

## 2015-11-14 DIAGNOSIS — I10 Essential (primary) hypertension: Secondary | ICD-10-CM | POA: Diagnosis not present

## 2015-11-14 DIAGNOSIS — R7303 Prediabetes: Secondary | ICD-10-CM | POA: Diagnosis not present

## 2015-11-14 DIAGNOSIS — N3281 Overactive bladder: Secondary | ICD-10-CM | POA: Diagnosis not present

## 2015-12-09 ENCOUNTER — Encounter: Payer: Self-pay | Admitting: Neurology

## 2015-12-09 ENCOUNTER — Ambulatory Visit (INDEPENDENT_AMBULATORY_CARE_PROVIDER_SITE_OTHER): Payer: Medicare Other | Admitting: Neurology

## 2015-12-09 ENCOUNTER — Other Ambulatory Visit (INDEPENDENT_AMBULATORY_CARE_PROVIDER_SITE_OTHER): Payer: Medicare Other

## 2015-12-09 ENCOUNTER — Telehealth: Payer: Self-pay | Admitting: Neurology

## 2015-12-09 VITALS — BP 110/60 | HR 82 | Ht 59.0 in | Wt 125.1 lb

## 2015-12-09 DIAGNOSIS — R413 Other amnesia: Secondary | ICD-10-CM

## 2015-12-09 DIAGNOSIS — R251 Tremor, unspecified: Secondary | ICD-10-CM

## 2015-12-09 LAB — TSH: TSH: 7.55 u[IU]/mL — ABNORMAL HIGH (ref 0.35–4.50)

## 2015-12-09 LAB — VITAMIN B12: VITAMIN B 12: 412 pg/mL (ref 211–911)

## 2015-12-09 LAB — FOLATE: FOLATE: 21.7 ng/mL (ref >5.9)

## 2015-12-09 NOTE — Telephone Encounter (Signed)
-----   Message from [place], DO sent at 12/09/2015  1:01 PM EDT ----- Please let pt know that TSH was elevated and looks like she could be hypothyroid.  Please send labs to PCP and ask pt to f/u

## 2015-12-09 NOTE — Telephone Encounter (Signed)
Left message on machine for patient to call back.
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
## 2015-12-09 NOTE — Telephone Encounter (Signed)
Patient made aware.

## 2015-12-10 LAB — RPR

## 2015-12-18 ENCOUNTER — Telehealth: Payer: Self-pay | Admitting: Neurology

## 2015-12-18 ENCOUNTER — Ambulatory Visit
Admission: RE | Admit: 2015-12-18 | Discharge: 2015-12-18 | Disposition: A | Discharge status: Home / Self Care | Payer: Medicare Other | Source: Ambulatory Visit | Attending: Neurology | Admitting: Neurology

## 2015-12-18 DIAGNOSIS — R413 Other amnesia: Secondary | ICD-10-CM

## 2015-12-18 DIAGNOSIS — R51 Headache: Secondary | ICD-10-CM | POA: Diagnosis not present

## 2015-12-18 DIAGNOSIS — R251 Tremor, unspecified: Secondary | ICD-10-CM

## 2015-12-18 NOTE — Telephone Encounter (Signed)
Left message on machine for patient to call back.

## 2015-12-18 NOTE — Telephone Encounter (Signed)
-----   Message from Terry, DO sent at 12/18/2015 10:36 AM EDT ----- Some scattered T2 hyperintensities but otherwise looks good.  Luvenia Starch, you can let pt know that brain looks okay - nothing new or unexpected.

## 2015-12-18 NOTE — Telephone Encounter (Signed)
Patient made aware of results.  

## 2016-01-02 ENCOUNTER — Telehealth: Payer: Self-pay | Admitting: Neurology

## 2016-01-02 NOTE — Telephone Encounter (Signed)
Christina [name] August 16, 2050. She would like you to please call her at [phone]. Thank you

## 2016-01-02 NOTE — Telephone Encounter (Signed)
Spoke with patient and she is ready to schedule Neuro Psych evaluation. Can someone please call patient to set this up?

## 2016-01-16 ENCOUNTER — Ambulatory Visit (INDEPENDENT_AMBULATORY_CARE_PROVIDER_SITE_OTHER): Payer: Medicare Other | Admitting: Psychology

## 2016-01-16 ENCOUNTER — Encounter: Payer: Self-pay | Admitting: Psychology

## 2016-01-16 DIAGNOSIS — R413 Other amnesia: Secondary | ICD-10-CM | POA: Diagnosis not present
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
## 2016-01-23 ENCOUNTER — Ambulatory Visit (INDEPENDENT_AMBULATORY_CARE_PROVIDER_SITE_OTHER): Payer: Medicare Other | Admitting: Psychology

## 2016-01-23 ENCOUNTER — Other Ambulatory Visit: Payer: Medicare Other

## 2016-01-23 DIAGNOSIS — E039 Hypothyroidism, unspecified: Secondary | ICD-10-CM | POA: Diagnosis not present

## 2016-01-23 DIAGNOSIS — R413 Other amnesia: Secondary | ICD-10-CM

## 2016-01-23 NOTE — Progress Notes (Signed)
   Neuropsychology Note  Christina [name] returned today for 2 hours of neuropsychological testing with technician, Milana Kidney, BS, under the supervision of Dr. Macarthur Critchley. The patient did not appear overtly distressed by the testing session, per behavioral observation as reported to me or via self-report to the technician. Rest breaks were offered. Christina [name] will return within 2 weeks for a feedback session with Dr. Si Raider at which time her test performances, clinical impressions and treatment recommendations will be reviewed in detail. she understands she can contact our office should she require my assistance before this time.  Full report to follow.

## 2016-01-30 ENCOUNTER — Ambulatory Visit (INDEPENDENT_AMBULATORY_CARE_PROVIDER_SITE_OTHER): Payer: Medicare Other | Admitting: Psychology

## 2016-01-30 DIAGNOSIS — F0390 Unspecified dementia without behavioral disturbance: Secondary | ICD-10-CM
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
## 2016-02-07 DIAGNOSIS — R42 Dizziness and giddiness: Secondary | ICD-10-CM | POA: Diagnosis not present

## 2016-02-17 DIAGNOSIS — S0502XA Injury of conjunctiva and corneal abrasion without foreign body, left eye, initial encounter: Secondary | ICD-10-CM | POA: Diagnosis not present
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
## 2016-02-25 ENCOUNTER — Ambulatory Visit (INDEPENDENT_AMBULATORY_CARE_PROVIDER_SITE_OTHER): Payer: Medicare Other | Admitting: Neurology

## 2016-02-25 ENCOUNTER — Encounter: Payer: Self-pay | Admitting: Neurology

## 2016-02-25 VITALS — BP 110/60 | HR 79 | Ht 59.0 in | Wt 124.0 lb

## 2016-02-25 DIAGNOSIS — W19XXXA Unspecified fall, initial encounter: Secondary | ICD-10-CM | POA: Diagnosis not present

## 2016-02-25 DIAGNOSIS — F0280 Dementia in other diseases classified elsewhere without behavioral disturbance: Secondary | ICD-10-CM

## 2016-02-25 DIAGNOSIS — E038 Other specified hypothyroidism: Secondary | ICD-10-CM | POA: Diagnosis not present

## 2016-02-25 DIAGNOSIS — E039 Hypothyroidism, unspecified: Secondary | ICD-10-CM | POA: Insufficient documentation

## 2016-02-25 DIAGNOSIS — G3109 Other frontotemporal dementia: Secondary | ICD-10-CM

## 2016-02-25 NOTE — Patient Instructions (Signed)
1. Stop Antivert (Meclizine)  2. You have been referred to Physical therapy at [facility]. They will call you directly to schedule an appointment.  Please call [phone] if you do not hear from them.

## 2016-03-09 DIAGNOSIS — E039 Hypothyroidism, unspecified: Secondary | ICD-10-CM | POA: Diagnosis not present

## 2016-04-07 ENCOUNTER — Ambulatory Visit: Payer: Medicare Other | Attending: Neurology | Admitting: Physical Therapy

## 2016-04-07 ENCOUNTER — Encounter: Payer: Self-pay | Admitting: Physical Therapy

## 2016-04-07 VITALS — BP 130/49 | HR 70

## 2016-04-07 DIAGNOSIS — M6281 Muscle weakness (generalized): Secondary | ICD-10-CM | POA: Insufficient documentation

## 2016-04-07 DIAGNOSIS — R262 Difficulty in walking, not elsewhere classified: Secondary | ICD-10-CM | POA: Diagnosis not present
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
## 2016-04-07 NOTE — Patient Instructions (Addendum)
Single Leg - Eyes Closed    Stand on your left leg.  Hold 30 seconds. Repeat on each leg 2 times. Do 3 sessions per day. Do 5 days each week.  Copyright  VHI. All rights reserved.  Alternating Step Up    Stand facing step. Alternate tapping your left foot and then your right foot up to step.   Perform 20 reps. Do this 3 times each day. Do 5 days each week. **You must have a counter next to you in case you were to lose your balance.  Copyright  VHI. All rights reserved.

## 2016-04-10 ENCOUNTER — Ambulatory Visit: Payer: Medicare Other

## 2016-04-15 ENCOUNTER — Ambulatory Visit: Payer: Medicare Other

## 2016-04-15 VITALS — BP 116/62 | HR 72

## 2016-04-15 DIAGNOSIS — M6281 Muscle weakness (generalized): Secondary | ICD-10-CM | POA: Diagnosis not present

## 2016-04-15 DIAGNOSIS — R262 Difficulty in walking, not elsewhere classified: Secondary | ICD-10-CM | POA: Diagnosis not present
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
## 2016-04-17 ENCOUNTER — Ambulatory Visit: Payer: Medicare Other

## 2016-04-17 DIAGNOSIS — R262 Difficulty in walking, not elsewhere classified: Secondary | ICD-10-CM

## 2016-04-17 DIAGNOSIS — M6281 Muscle weakness (generalized): Secondary | ICD-10-CM | POA: Diagnosis not present
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
## 2016-04-21 ENCOUNTER — Ambulatory Visit: Payer: Medicare Other | Attending: Neurology

## 2016-04-21 DIAGNOSIS — R262 Difficulty in walking, not elsewhere classified: Secondary | ICD-10-CM | POA: Diagnosis not present

## 2016-04-21 DIAGNOSIS — M6281 Muscle weakness (generalized): Secondary | ICD-10-CM | POA: Diagnosis not present
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
## 2016-04-23 ENCOUNTER — Ambulatory Visit: Payer: Medicare Other

## 2016-04-23 VITALS — BP 106/52

## 2016-04-23 DIAGNOSIS — R262 Difficulty in walking, not elsewhere classified: Secondary | ICD-10-CM

## 2016-04-23 DIAGNOSIS — M6281 Muscle weakness (generalized): Secondary | ICD-10-CM | POA: Diagnosis not present
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
## 2016-04-24 DIAGNOSIS — R350 Frequency of micturition: Secondary | ICD-10-CM | POA: Diagnosis not present

## 2016-04-24 DIAGNOSIS — N3944 Nocturnal enuresis: Secondary | ICD-10-CM | POA: Diagnosis not present

## 2016-04-24 DIAGNOSIS — N3946 Mixed incontinence: Secondary | ICD-10-CM | POA: Diagnosis not present

## 2016-04-24 DIAGNOSIS — R351 Nocturia: Secondary | ICD-10-CM | POA: Diagnosis not present

## 2016-04-24 DIAGNOSIS — Z23 Encounter for immunization: Secondary | ICD-10-CM | POA: Diagnosis not present

## 2016-04-28 ENCOUNTER — Ambulatory Visit: Payer: Medicare Other

## 2016-04-28 DIAGNOSIS — R262 Difficulty in walking, not elsewhere classified: Secondary | ICD-10-CM | POA: Diagnosis not present

## 2016-04-28 DIAGNOSIS — M6281 Muscle weakness (generalized): Secondary | ICD-10-CM | POA: Diagnosis not present
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
## 2016-04-30 ENCOUNTER — Ambulatory Visit: Payer: Medicare Other

## 2016-04-30 VITALS — BP 127/86 | HR 77

## 2016-04-30 DIAGNOSIS — M6281 Muscle weakness (generalized): Secondary | ICD-10-CM | POA: Diagnosis not present

## 2016-04-30 DIAGNOSIS — R262 Difficulty in walking, not elsewhere classified: Secondary | ICD-10-CM | POA: Diagnosis not present
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
## 2016-05-05 ENCOUNTER — Ambulatory Visit: Payer: Medicare Other

## 2016-05-05 DIAGNOSIS — R262 Difficulty in walking, not elsewhere classified: Secondary | ICD-10-CM | POA: Diagnosis not present

## 2016-05-05 DIAGNOSIS — M6281 Muscle weakness (generalized): Secondary | ICD-10-CM
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
## 2016-05-06 DIAGNOSIS — N3944 Nocturnal enuresis: Secondary | ICD-10-CM | POA: Diagnosis not present

## 2016-05-06 DIAGNOSIS — R350 Frequency of micturition: Secondary | ICD-10-CM | POA: Diagnosis not present

## 2016-05-06 DIAGNOSIS — N3946 Mixed incontinence: Secondary | ICD-10-CM | POA: Diagnosis not present

## 2016-05-07 ENCOUNTER — Ambulatory Visit: Payer: Medicare Other

## 2016-05-07 VITALS — BP 120/68 | HR 69

## 2016-05-07 DIAGNOSIS — R262 Difficulty in walking, not elsewhere classified: Secondary | ICD-10-CM | POA: Diagnosis not present

## 2016-05-07 DIAGNOSIS — M6281 Muscle weakness (generalized): Secondary | ICD-10-CM | POA: Diagnosis not present
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
## 2016-05-12 ENCOUNTER — Ambulatory Visit: Payer: Medicare Other

## 2016-05-12 DIAGNOSIS — M6281 Muscle weakness (generalized): Secondary | ICD-10-CM | POA: Diagnosis not present

## 2016-05-12 DIAGNOSIS — R262 Difficulty in walking, not elsewhere classified: Secondary | ICD-10-CM
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
## 2016-05-14 ENCOUNTER — Ambulatory Visit: Payer: Medicare Other | Admitting: Physical Therapy

## 2016-05-14 ENCOUNTER — Encounter: Payer: Self-pay | Admitting: Physical Therapy

## 2016-05-14 VITALS — BP 139/66 | HR 64

## 2016-05-14 DIAGNOSIS — R262 Difficulty in walking, not elsewhere classified: Secondary | ICD-10-CM

## 2016-05-14 DIAGNOSIS — M6281 Muscle weakness (generalized): Secondary | ICD-10-CM
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
## 2016-05-15 DIAGNOSIS — N3946 Mixed incontinence: Secondary | ICD-10-CM | POA: Diagnosis not present

## 2016-05-15 DIAGNOSIS — N3944 Nocturnal enuresis: Secondary | ICD-10-CM | POA: Diagnosis not present

## 2016-05-19 ENCOUNTER — Ambulatory Visit: Payer: Medicare Other

## 2016-05-19 VITALS — BP 132/66

## 2016-05-19 DIAGNOSIS — M6281 Muscle weakness (generalized): Secondary | ICD-10-CM | POA: Diagnosis not present

## 2016-05-19 DIAGNOSIS — R262 Difficulty in walking, not elsewhere classified: Secondary | ICD-10-CM | POA: Diagnosis not present
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
## 2016-05-21 ENCOUNTER — Ambulatory Visit: Payer: Medicare Other | Attending: Neurology

## 2016-05-21 DIAGNOSIS — R262 Difficulty in walking, not elsewhere classified: Secondary | ICD-10-CM | POA: Diagnosis not present

## 2016-05-21 DIAGNOSIS — M6281 Muscle weakness (generalized): Secondary | ICD-10-CM | POA: Insufficient documentation
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
## 2016-05-26 ENCOUNTER — Ambulatory Visit: Payer: Medicare Other

## 2016-05-26 VITALS — BP 136/73

## 2016-05-26 DIAGNOSIS — M6281 Muscle weakness (generalized): Secondary | ICD-10-CM | POA: Diagnosis not present

## 2016-05-26 DIAGNOSIS — R262 Difficulty in walking, not elsewhere classified: Secondary | ICD-10-CM
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
## 2016-05-28 ENCOUNTER — Ambulatory Visit: Payer: Medicare Other

## 2016-05-28 DIAGNOSIS — M6281 Muscle weakness (generalized): Secondary | ICD-10-CM

## 2016-05-28 DIAGNOSIS — R262 Difficulty in walking, not elsewhere classified: Secondary | ICD-10-CM | POA: Diagnosis not present
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
## 2016-06-01 ENCOUNTER — Ambulatory Visit: Payer: Medicare Other

## 2016-06-01 VITALS — BP 113/61

## 2016-06-01 DIAGNOSIS — R262 Difficulty in walking, not elsewhere classified: Secondary | ICD-10-CM | POA: Diagnosis not present

## 2016-06-01 DIAGNOSIS — M6281 Muscle weakness (generalized): Secondary | ICD-10-CM | POA: Diagnosis not present
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
## 2016-06-09 DIAGNOSIS — I779 Disorder of arteries and arterioles, unspecified: Secondary | ICD-10-CM | POA: Diagnosis not present

## 2016-06-09 DIAGNOSIS — I10 Essential (primary) hypertension: Secondary | ICD-10-CM | POA: Diagnosis not present

## 2016-06-09 DIAGNOSIS — E039 Hypothyroidism, unspecified: Secondary | ICD-10-CM | POA: Diagnosis not present

## 2016-06-09 DIAGNOSIS — R7303 Prediabetes: Secondary | ICD-10-CM | POA: Diagnosis not present

## 2016-06-09 DIAGNOSIS — F322 Major depressive disorder, single episode, severe without psychotic features: Secondary | ICD-10-CM | POA: Diagnosis not present

## 2016-06-09 DIAGNOSIS — N3281 Overactive bladder: Secondary | ICD-10-CM | POA: Diagnosis not present

## 2016-06-09 DIAGNOSIS — Z23 Encounter for immunization: Secondary | ICD-10-CM | POA: Diagnosis not present

## 2016-06-09 DIAGNOSIS — E7800 Pure hypercholesterolemia, unspecified: Secondary | ICD-10-CM | POA: Diagnosis not present

## 2016-06-09 DIAGNOSIS — M8580 Other specified disorders of bone density and structure, unspecified site: Secondary | ICD-10-CM | POA: Diagnosis not present

## 2016-06-09 DIAGNOSIS — N6019 Diffuse cystic mastopathy of unspecified breast: Secondary | ICD-10-CM | POA: Diagnosis not present

## 2016-06-09 DIAGNOSIS — F0280 Dementia in other diseases classified elsewhere without behavioral disturbance: Secondary | ICD-10-CM | POA: Diagnosis not present

## 2016-06-09 DIAGNOSIS — Z0000 Encounter for general adult medical examination without abnormal findings: Secondary | ICD-10-CM | POA: Diagnosis not present

## 2016-06-17 DIAGNOSIS — R350 Frequency of micturition: Secondary | ICD-10-CM | POA: Diagnosis not present

## 2016-06-17 DIAGNOSIS — N3946 Mixed incontinence: Secondary | ICD-10-CM | POA: Diagnosis not present

## 2016-07-07 DIAGNOSIS — R350 Frequency of micturition: Secondary | ICD-10-CM | POA: Diagnosis not present

## 2016-07-07 DIAGNOSIS — N3946 Mixed incontinence: Secondary | ICD-10-CM | POA: Diagnosis not present

## 2016-07-14 DIAGNOSIS — N3946 Mixed incontinence: Secondary | ICD-10-CM | POA: Diagnosis not present

## 2016-07-21 DIAGNOSIS — R350 Frequency of micturition: Secondary | ICD-10-CM | POA: Diagnosis not present

## 2016-07-21 DIAGNOSIS — N3946 Mixed incontinence: Secondary | ICD-10-CM | POA: Diagnosis not present

## 2016-07-28 DIAGNOSIS — R350 Frequency of micturition: Secondary | ICD-10-CM | POA: Diagnosis not present

## 2016-08-04 ENCOUNTER — Encounter: Payer: Medicare Other | Admitting: Psychology

## 2016-08-04 DIAGNOSIS — R350 Frequency of micturition: Secondary | ICD-10-CM | POA: Diagnosis not present

## 2016-08-04 IMAGING — MR MR HEAD W/O CM
8 series · 38 of 48 positions shown · non-contrast
Comparison: Carotid Doppler ultrasound 10/25/2014.

CLINICAL DATA: 65-year-old female with memory loss and tremor.
Headaches. Initial encounter.

EXAM:
MRI HEAD WITHOUT CONTRAST
TECHNIQUE: Multiplanar, multiecho pulse sequences of the brain and surrounding
structures were obtained without intravenous contrast.

[Series 2: T1 · sagittal · 5.0mm · 0.45mm/px · 3 of 19 slices shown]
[im 1/19]
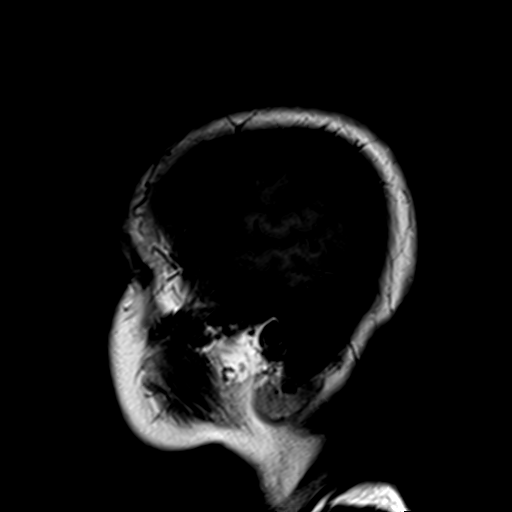
[im 10/19]
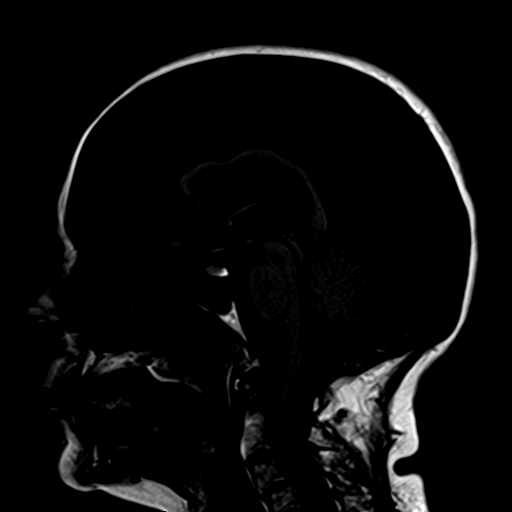
[im 19/19]
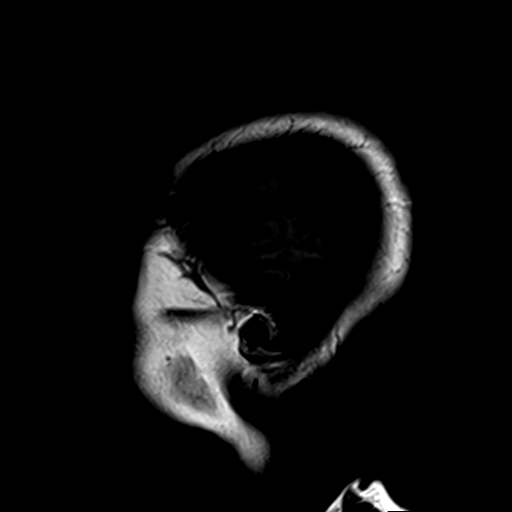

[Series 3: DWI · axial · 3.0mm · 0.90mm/px · z∈[-44,+101]mm · 9 of 100 slices shown]
[im 1/100]
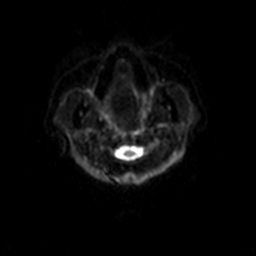
[im 17/100]
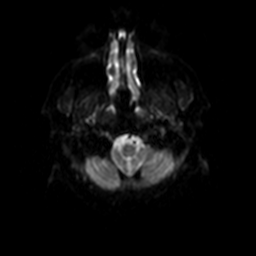
[im 34/100]
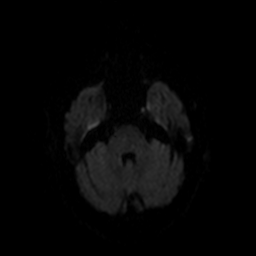
[im 42/100]
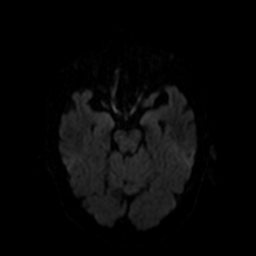
[im 50/100]
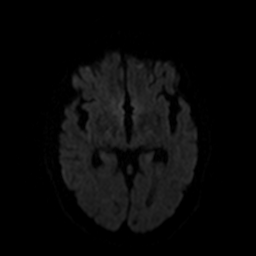
[im 58/100]
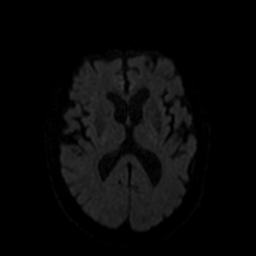
[im 67/100]
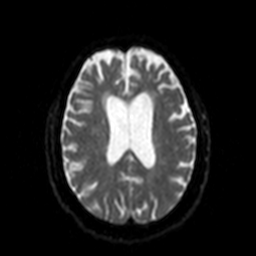
[im 83/100]
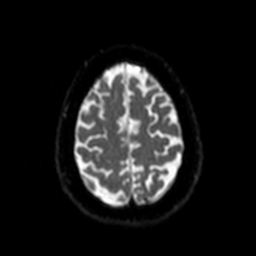
[im 100/100]
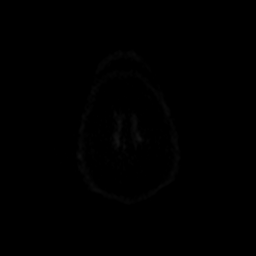

[Series 4: dwi_adc · axial · 3.0mm · 0.90mm/px · z∈[-44,+101]mm · 6 of 50 slices shown]
[im 1/50]
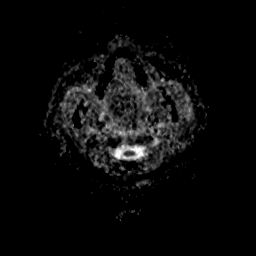
[im 10/50]
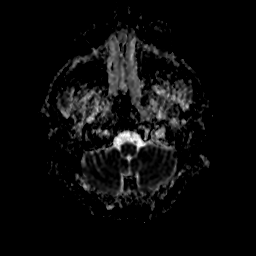
[im 20/50]
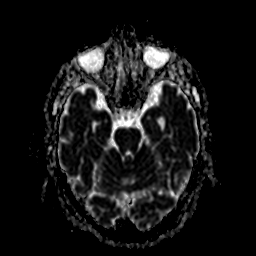
[im 30/50]
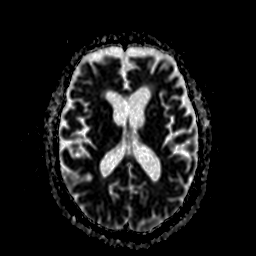
[im 40/50]
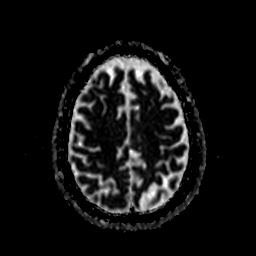
[im 50/50]
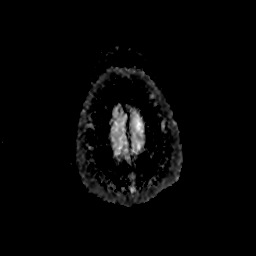

[Series 5: FLAIR · axial · 5.0mm · 0.45mm/px · z∈[-45,+102]mm · 3 of 24 slices shown]
[im 1/24]
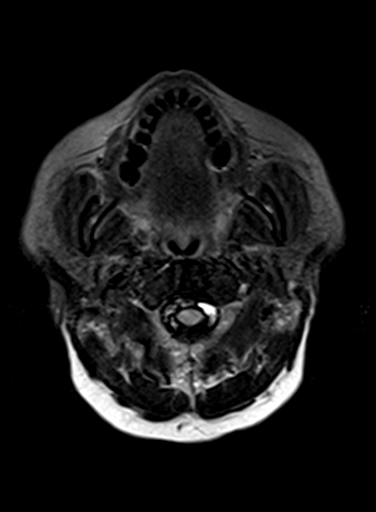
[im 12/24]
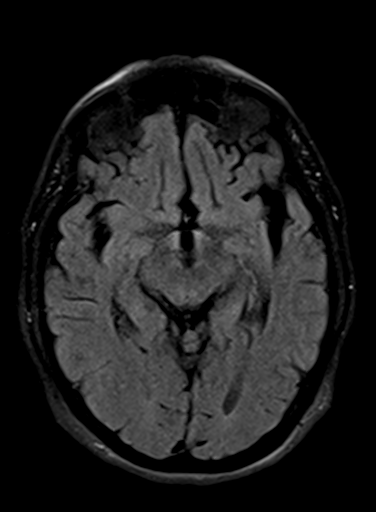
[im 24/24]
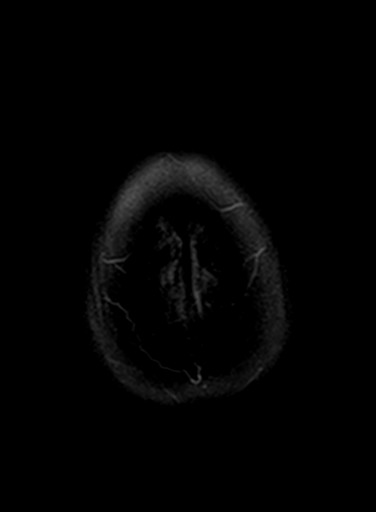

[Series 6: T2 · axial · 5.0mm · 0.45mm/px · z∈[-45,+102]mm · 3 of 24 slices shown (1 of 2)]
[im 1/24]
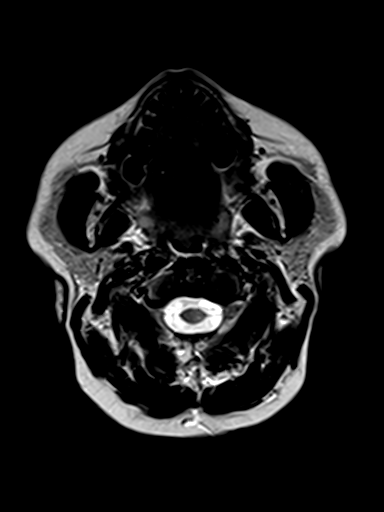
[im 12/24]
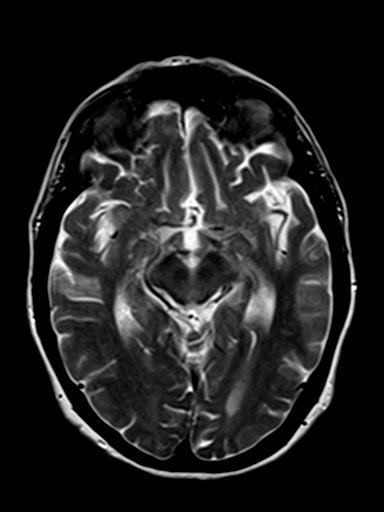
[im 24/24]
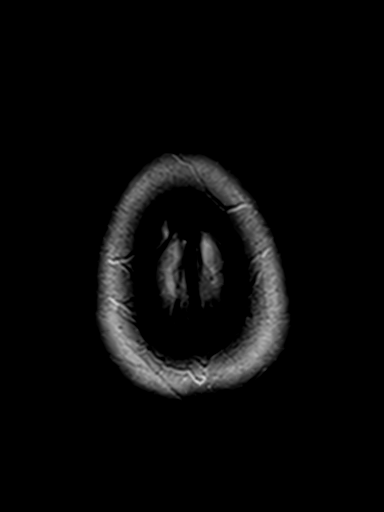

[Series 8: swi_images · axial · 2.2mm · 0.90mm/px · z∈[-37,+94]mm · 8 of 60 slices shown]
[im 1/60]
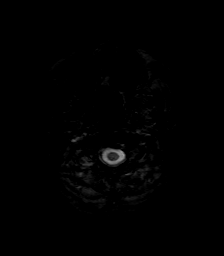
[im 9/60]
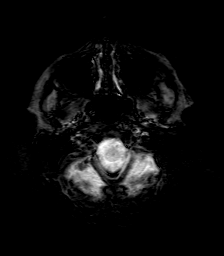
[im 17/60]
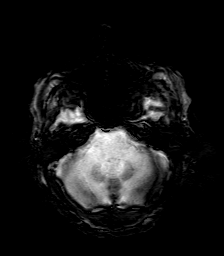
[im 26/60]
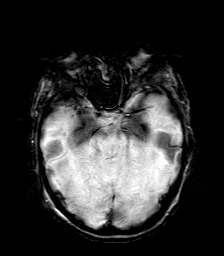
[im 34/60]
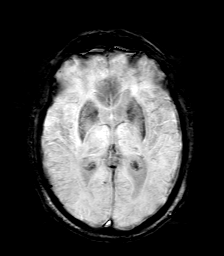
[im 43/60]
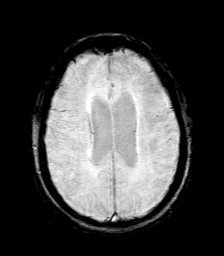
[im 51/60]
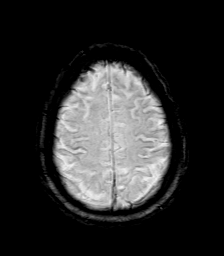
[im 60/60]
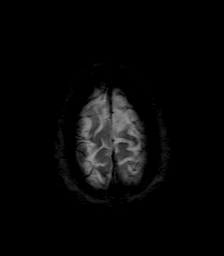

[Series 9: axial (person_name)1 volume · axial · 2.0mm · 0.45mm/px · z∈[-42,-8]mm · 3 of 72 slices shown]
[im 1/72]
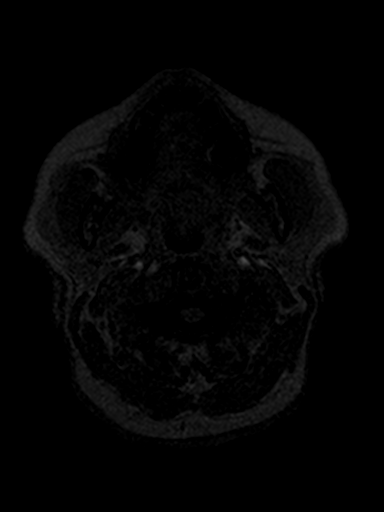
[im 9/72]
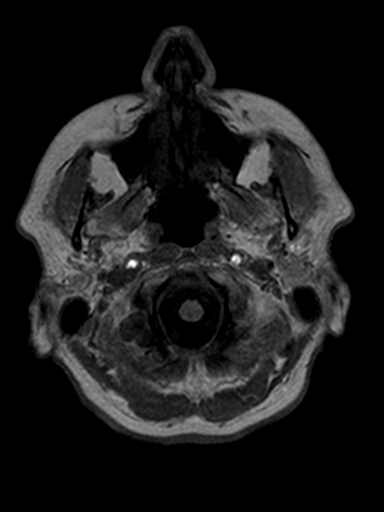
[im 18/72]
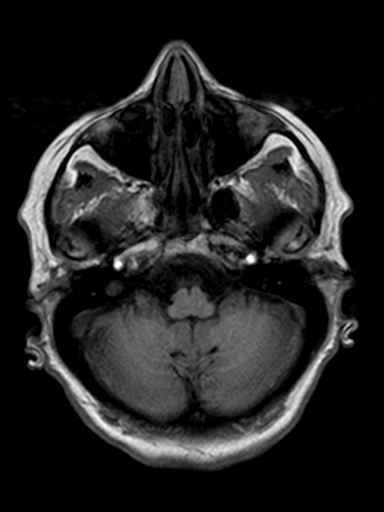

[Series 10: T2 · coronal · 5.0mm · 0.41mm/px · 3 of 24 slices shown (2 of 2)]
[im 1/24]
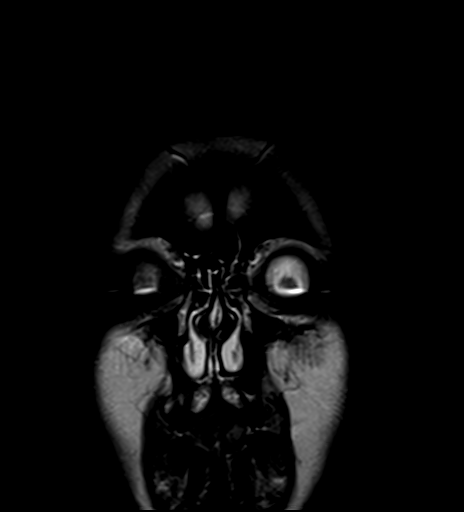
[im 12/24]
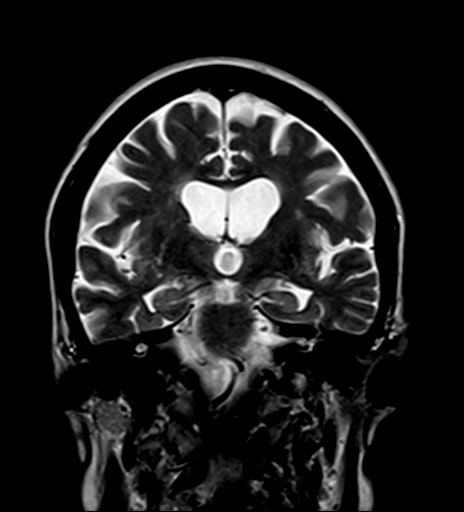
[im 24/24]
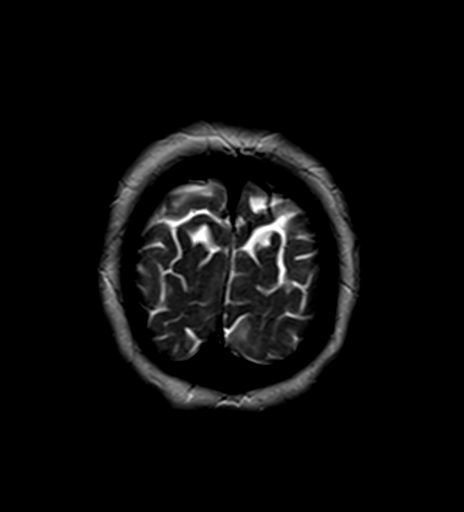

[38 of 48 positions shown; findings below may reference images not displayed]

FINDINGS: Cerebral volume appears within normal limits for age, with no areas
of disproportionate volume loss identified. No restricted diffusion
to suggest acute infarction. No midline shift, mass effect, evidence
of mass lesion, ventriculomegaly, extra-axial collection or acute
intracranial hemorrhage. Cervicomedullary junction and pituitary are
within normal limits. Major intracranial vascular flow voids are
preserved.

Minimal to mild for age nonspecific cerebral white matter T2 and
FLAIR hyperintensity. No cortical encephalomalacia. No definite
chronic cerebral blood products. Deep gray matter nuclei, brainstem,
and cerebellum are within normal limits. Visible internal auditory
structures appear normal.

Mastoids and paranasal sinuses are clear. Negative orbit and scalp
soft tissues.

Multilevel mild spondylolisthesis with evidence of chronic disc and
endplate degeneration in the visible cervical spine.
IMPRESSION: No acute intracranial abnormality identified, and largely
unremarkable for age noncontrast MRI appearance of the brain.

## 2016-08-06 ENCOUNTER — Encounter: Payer: Medicare Other | Admitting: Psychology

## 2016-08-07 ENCOUNTER — Other Ambulatory Visit: Payer: Self-pay | Admitting: Advanced Practice Midwife

## 2016-08-07 DIAGNOSIS — Z1231 Encounter for screening mammogram for malignant neoplasm of breast: Secondary | ICD-10-CM

## 2016-08-11 DIAGNOSIS — R350 Frequency of micturition: Secondary | ICD-10-CM | POA: Diagnosis not present

## 2016-08-13 ENCOUNTER — Other Ambulatory Visit: Payer: Self-pay | Admitting: Family Medicine

## 2016-08-13 DIAGNOSIS — Z1231 Encounter for screening mammogram for malignant neoplasm of breast: Secondary | ICD-10-CM

## 2016-08-14 DIAGNOSIS — J00 Acute nasopharyngitis [common cold]: Secondary | ICD-10-CM | POA: Diagnosis not present

## 2016-08-18 DIAGNOSIS — R350 Frequency of micturition: Secondary | ICD-10-CM | POA: Diagnosis not present

## 2016-08-20 ENCOUNTER — Ambulatory Visit (INDEPENDENT_AMBULATORY_CARE_PROVIDER_SITE_OTHER): Payer: Medicare Other | Admitting: Psychology

## 2016-08-20 ENCOUNTER — Encounter: Payer: Self-pay | Admitting: Psychology

## 2016-08-20 DIAGNOSIS — F0391 Unspecified dementia with behavioral disturbance: Secondary | ICD-10-CM

## 2016-08-20 NOTE — Progress Notes (Signed)
   Neuropsychology Note  Christina [name] returned today for 2 hours of neuropsychological testing with technician, Milana Kidney, BS, under the supervision of Dr. Macarthur Critchley. The patient did not appear overtly distressed by the testing session, per behavioral observation or via self-report to the technician. Rest breaks were offered. Christina [name] will return within 2 weeks for a feedback session with Dr. Si Raider at which time her test performances, clinical impressions and treatment recommendations will be reviewed in detail. The patient understands she can contact our office should she require our assistance before this time.  Full report to follow.
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
## 2016-08-25 DIAGNOSIS — R350 Frequency of micturition: Secondary | ICD-10-CM | POA: Diagnosis not present
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
## 2016-08-27 ENCOUNTER — Ambulatory Visit (INDEPENDENT_AMBULATORY_CARE_PROVIDER_SITE_OTHER): Payer: Medicare Other | Admitting: Psychology

## 2016-08-27 ENCOUNTER — Ambulatory Visit: Payer: Medicare Other | Admitting: Neurology

## 2016-08-27 ENCOUNTER — Encounter: Payer: Self-pay | Admitting: Psychology

## 2016-08-27 DIAGNOSIS — F0391 Unspecified dementia with behavioral disturbance: Secondary | ICD-10-CM | POA: Diagnosis not present

## 2016-08-27 NOTE — Progress Notes (Signed)
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 psychologist.      Thank you for your referral of Christina [name]. Please feel free to contact me if you have any questions or concerns regarding this report.

## 2016-08-27 NOTE — Patient Instructions (Signed)
Compared to your last evaluation about six months ago, there is evidence of decline. 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  It is unclear exactly what form of dementia is present. Posterior Cortical Atrophy (which is sometimes considered the "visual variant" of Alzheimer's disease) is one possibility. In this disease, visual-spatial processing is the first symptom, and memory and language remain intact for a while.  Frontotemporal dementia-behavioral variant is another possibility. In this disease, behavioral changes and problems with judgment, decision making and social behavior are often prominent. However, visual-spatial processing is not usually affected early on. Lewy Body Dementia was considered due to the neurocognitive deficits seen on testing and the mobility changes, but neurologic exam has not shown evidence of this disease. Further testing could include repeat MRI, and PET or functional MRI.   The Alzheimer's Association provides some education and support regarding Posterior Cortical Atrophy. (Information attached)  The Association for Frontotemporal Degeneration has a great website with lots of resources (theaftd.org) - Information attached.

## 2016-09-01 DIAGNOSIS — R350 Frequency of micturition: Secondary | ICD-10-CM | POA: Diagnosis not present

## 2016-09-01 DIAGNOSIS — N3946 Mixed incontinence: Secondary | ICD-10-CM | POA: Diagnosis not present
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
## 2016-09-07 ENCOUNTER — Encounter: Payer: Self-pay | Admitting: Neurology

## 2016-09-07 ENCOUNTER — Ambulatory Visit (INDEPENDENT_AMBULATORY_CARE_PROVIDER_SITE_OTHER): Payer: Medicare Other | Admitting: Neurology

## 2016-09-07 VITALS — BP 134/76 | HR 86 | Ht 59.0 in | Wt 122.0 lb

## 2016-09-07 DIAGNOSIS — F0390 Unspecified dementia without behavioral disturbance: Secondary | ICD-10-CM | POA: Diagnosis not present

## 2016-09-07 DIAGNOSIS — G319 Degenerative disease of nervous system, unspecified: Secondary | ICD-10-CM | POA: Diagnosis not present

## 2016-09-07 DIAGNOSIS — H547 Unspecified visual loss: Secondary | ICD-10-CM | POA: Diagnosis not present

## 2016-09-07 DIAGNOSIS — F0280 Dementia in other diseases classified elsewhere without behavioral disturbance: Secondary | ICD-10-CM

## 2016-09-07 MED ORDER — DONEPEZIL HCL 10 MG PO TABS: 10.0000 mg | ORAL_TABLET | Freq: Every day | ORAL | 1 refills | Status: DC

## 2016-09-07 MED ORDER — DONEPEZIL HCL 5 MG PO TABS: 5.0000 mg | ORAL_TABLET | Freq: Every day | ORAL | 0 refills | Status: DC

## 2016-09-07 NOTE — Patient Instructions (Signed)
1. Start Aricept 5 mg tablets daily for one month, then switch to Aricept 10 mg tablets daily.

## 2016-09-08 DIAGNOSIS — R350 Frequency of micturition: Secondary | ICD-10-CM | POA: Diagnosis not present

## 2016-09-15 DIAGNOSIS — R350 Frequency of micturition: Secondary | ICD-10-CM | POA: Diagnosis not present

## 2016-09-22 DIAGNOSIS — R350 Frequency of micturition: Secondary | ICD-10-CM | POA: Diagnosis not present

## 2016-09-28 ENCOUNTER — Other Ambulatory Visit: Payer: Self-pay | Admitting: Family Medicine

## 2016-09-28 ENCOUNTER — Ambulatory Visit
Admission: RE | Admit: 2016-09-28 | Discharge: 2016-09-28 | Disposition: A | Discharge status: Home / Self Care | Payer: Medicare Other | Source: Ambulatory Visit | Attending: Advanced Practice Midwife | Admitting: Advanced Practice Midwife

## 2016-09-28 DIAGNOSIS — Z1231 Encounter for screening mammogram for malignant neoplasm of breast: Secondary | ICD-10-CM

## 2016-10-13 DIAGNOSIS — N3946 Mixed incontinence: Secondary | ICD-10-CM | POA: Diagnosis not present

## 2016-11-03 DIAGNOSIS — R350 Frequency of micturition: Secondary | ICD-10-CM | POA: Diagnosis not present

## 2016-12-01 DIAGNOSIS — N3944 Nocturnal enuresis: Secondary | ICD-10-CM | POA: Diagnosis not present

## 2016-12-01 DIAGNOSIS — N3946 Mixed incontinence: Secondary | ICD-10-CM | POA: Diagnosis not present

## 2016-12-18 DIAGNOSIS — R350 Frequency of micturition: Secondary | ICD-10-CM | POA: Diagnosis not present

## 2016-12-18 DIAGNOSIS — N3946 Mixed incontinence: Secondary | ICD-10-CM | POA: Diagnosis not present

## 2016-12-25 DIAGNOSIS — F322 Major depressive disorder, single episode, severe without psychotic features: Secondary | ICD-10-CM | POA: Diagnosis not present

## 2016-12-25 DIAGNOSIS — R7303 Prediabetes: Secondary | ICD-10-CM | POA: Diagnosis not present

## 2016-12-25 DIAGNOSIS — E039 Hypothyroidism, unspecified: Secondary | ICD-10-CM | POA: Diagnosis not present

## 2016-12-25 DIAGNOSIS — E7800 Pure hypercholesterolemia, unspecified: Secondary | ICD-10-CM | POA: Diagnosis not present

## 2016-12-25 DIAGNOSIS — F0280 Dementia in other diseases classified elsewhere without behavioral disturbance: Secondary | ICD-10-CM | POA: Diagnosis not present

## 2016-12-25 DIAGNOSIS — I10 Essential (primary) hypertension: Secondary | ICD-10-CM | POA: Diagnosis not present

## 2016-12-31 DIAGNOSIS — R350 Frequency of micturition: Secondary | ICD-10-CM | POA: Diagnosis not present

## 2016-12-31 DIAGNOSIS — N3944 Nocturnal enuresis: Secondary | ICD-10-CM | POA: Diagnosis not present

## 2016-12-31 DIAGNOSIS — N3946 Mixed incontinence: Secondary | ICD-10-CM | POA: Diagnosis not present

## 2017-01-13 DIAGNOSIS — R351 Nocturia: Secondary | ICD-10-CM | POA: Diagnosis not present

## 2017-01-13 DIAGNOSIS — N3946 Mixed incontinence: Secondary | ICD-10-CM | POA: Diagnosis not present
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
## 2017-03-08 ENCOUNTER — Ambulatory Visit (INDEPENDENT_AMBULATORY_CARE_PROVIDER_SITE_OTHER): Payer: Medicare Other | Admitting: Neurology

## 2017-03-08 ENCOUNTER — Encounter: Payer: Self-pay | Admitting: Neurology

## 2017-03-08 VITALS — BP 110/60 | HR 72 | Ht 59.0 in | Wt 126.0 lb

## 2017-03-08 DIAGNOSIS — F0280 Dementia in other diseases classified elsewhere without behavioral disturbance: Secondary | ICD-10-CM

## 2017-03-08 DIAGNOSIS — W19XXXA Unspecified fall, initial encounter: Secondary | ICD-10-CM

## 2017-03-08 DIAGNOSIS — G301 Alzheimer's disease with late onset: Secondary | ICD-10-CM | POA: Diagnosis not present

## 2017-03-08 DIAGNOSIS — R441 Visual hallucinations: Secondary | ICD-10-CM

## 2017-03-08 DIAGNOSIS — F482 Pseudobulbar affect: Secondary | ICD-10-CM | POA: Diagnosis not present

## 2017-03-08 MED ORDER — GAIT/TRANSFER BELT MISC: 1.0000 | Freq: Every day | 0 refills | Status: DC

## 2017-03-08 NOTE — Patient Instructions (Signed)
We will order physical therapy from home health and they will contact you directly to schedule.

## 2017-03-08 NOTE — Addendum Note (Signed)
Addended by: Annamaria Helling on: 03/08/2017 09:38 AM   Modules accepted: Orders

## 2017-03-15 DIAGNOSIS — Z7982 Long term (current) use of aspirin: Secondary | ICD-10-CM | POA: Diagnosis not present

## 2017-03-15 DIAGNOSIS — F329 Major depressive disorder, single episode, unspecified: Secondary | ICD-10-CM | POA: Diagnosis not present

## 2017-03-15 DIAGNOSIS — F419 Anxiety disorder, unspecified: Secondary | ICD-10-CM | POA: Diagnosis not present

## 2017-03-15 DIAGNOSIS — F0280 Dementia in other diseases classified elsewhere without behavioral disturbance: Secondary | ICD-10-CM | POA: Diagnosis not present

## 2017-03-15 DIAGNOSIS — R269 Unspecified abnormalities of gait and mobility: Secondary | ICD-10-CM | POA: Diagnosis not present

## 2017-03-15 DIAGNOSIS — G301 Alzheimer's disease with late onset: Secondary | ICD-10-CM | POA: Diagnosis not present

## 2017-03-15 DIAGNOSIS — Z9181 History of falling: Secondary | ICD-10-CM | POA: Diagnosis not present

## 2017-03-15 DIAGNOSIS — R443 Hallucinations, unspecified: Secondary | ICD-10-CM | POA: Diagnosis not present

## 2017-03-15 DIAGNOSIS — I10 Essential (primary) hypertension: Secondary | ICD-10-CM | POA: Diagnosis not present

## 2017-03-16 ENCOUNTER — Telehealth: Payer: Self-pay | Admitting: Neurology

## 2017-03-16 NOTE — Telephone Encounter (Signed)
Gave verbal orders to [facility], PT at [facility], for patient to be seen 2x week for 4 weeks and 1x week for 2 weeks. Also added OT for fine motor skills, memory and safety.

## 2017-03-19 DIAGNOSIS — R443 Hallucinations, unspecified: Secondary | ICD-10-CM | POA: Diagnosis not present

## 2017-03-19 DIAGNOSIS — I10 Essential (primary) hypertension: Secondary | ICD-10-CM | POA: Diagnosis not present

## 2017-03-19 DIAGNOSIS — R269 Unspecified abnormalities of gait and mobility: Secondary | ICD-10-CM | POA: Diagnosis not present

## 2017-03-19 DIAGNOSIS — G301 Alzheimer's disease with late onset: Secondary | ICD-10-CM | POA: Diagnosis not present

## 2017-03-19 DIAGNOSIS — F0280 Dementia in other diseases classified elsewhere without behavioral disturbance: Secondary | ICD-10-CM | POA: Diagnosis not present

## 2017-03-19 DIAGNOSIS — F329 Major depressive disorder, single episode, unspecified: Secondary | ICD-10-CM | POA: Diagnosis not present

## 2017-03-23 ENCOUNTER — Other Ambulatory Visit: Payer: Self-pay | Admitting: Neurology

## 2017-03-23 ENCOUNTER — Telehealth: Payer: Self-pay | Admitting: Neurology

## 2017-03-23 DIAGNOSIS — F0280 Dementia in other diseases classified elsewhere without behavioral disturbance: Secondary | ICD-10-CM | POA: Diagnosis not present

## 2017-03-23 DIAGNOSIS — R443 Hallucinations, unspecified: Secondary | ICD-10-CM | POA: Diagnosis not present

## 2017-03-23 DIAGNOSIS — F329 Major depressive disorder, single episode, unspecified: Secondary | ICD-10-CM | POA: Diagnosis not present

## 2017-03-23 DIAGNOSIS — I10 Essential (primary) hypertension: Secondary | ICD-10-CM | POA: Diagnosis not present

## 2017-03-23 DIAGNOSIS — R269 Unspecified abnormalities of gait and mobility: Secondary | ICD-10-CM | POA: Diagnosis not present

## 2017-03-23 DIAGNOSIS — G301 Alzheimer's disease with late onset: Secondary | ICD-10-CM | POA: Diagnosis not present

## 2017-03-23 NOTE — Telephone Encounter (Signed)
Gave verbal okay for sheila with [facility] OT to see patient 2x week for 2 weeks.

## 2017-03-26 DIAGNOSIS — F329 Major depressive disorder, single episode, unspecified: Secondary | ICD-10-CM | POA: Diagnosis not present

## 2017-03-26 DIAGNOSIS — G301 Alzheimer's disease with late onset: Secondary | ICD-10-CM | POA: Diagnosis not present

## 2017-03-26 DIAGNOSIS — I10 Essential (primary) hypertension: Secondary | ICD-10-CM | POA: Diagnosis not present

## 2017-03-26 DIAGNOSIS — F0280 Dementia in other diseases classified elsewhere without behavioral disturbance: Secondary | ICD-10-CM | POA: Diagnosis not present

## 2017-03-26 DIAGNOSIS — R443 Hallucinations, unspecified: Secondary | ICD-10-CM | POA: Diagnosis not present

## 2017-03-26 DIAGNOSIS — R269 Unspecified abnormalities of gait and mobility: Secondary | ICD-10-CM | POA: Diagnosis not present

## 2017-03-29 DIAGNOSIS — G301 Alzheimer's disease with late onset: Secondary | ICD-10-CM | POA: Diagnosis not present

## 2017-03-29 DIAGNOSIS — I10 Essential (primary) hypertension: Secondary | ICD-10-CM | POA: Diagnosis not present

## 2017-03-29 DIAGNOSIS — F329 Major depressive disorder, single episode, unspecified: Secondary | ICD-10-CM | POA: Diagnosis not present

## 2017-03-29 DIAGNOSIS — R443 Hallucinations, unspecified: Secondary | ICD-10-CM | POA: Diagnosis not present

## 2017-03-29 DIAGNOSIS — R269 Unspecified abnormalities of gait and mobility: Secondary | ICD-10-CM | POA: Diagnosis not present

## 2017-03-29 DIAGNOSIS — F0280 Dementia in other diseases classified elsewhere without behavioral disturbance: Secondary | ICD-10-CM | POA: Diagnosis not present

## 2017-03-31 DIAGNOSIS — R269 Unspecified abnormalities of gait and mobility: Secondary | ICD-10-CM | POA: Diagnosis not present

## 2017-03-31 DIAGNOSIS — F0280 Dementia in other diseases classified elsewhere without behavioral disturbance: Secondary | ICD-10-CM | POA: Diagnosis not present

## 2017-03-31 DIAGNOSIS — F329 Major depressive disorder, single episode, unspecified: Secondary | ICD-10-CM | POA: Diagnosis not present

## 2017-03-31 DIAGNOSIS — R443 Hallucinations, unspecified: Secondary | ICD-10-CM | POA: Diagnosis not present

## 2017-03-31 DIAGNOSIS — G301 Alzheimer's disease with late onset: Secondary | ICD-10-CM | POA: Diagnosis not present

## 2017-03-31 DIAGNOSIS — I10 Essential (primary) hypertension: Secondary | ICD-10-CM | POA: Diagnosis not present

## 2017-04-05 DIAGNOSIS — I10 Essential (primary) hypertension: Secondary | ICD-10-CM | POA: Diagnosis not present

## 2017-04-05 DIAGNOSIS — G301 Alzheimer's disease with late onset: Secondary | ICD-10-CM | POA: Diagnosis not present

## 2017-04-05 DIAGNOSIS — R269 Unspecified abnormalities of gait and mobility: Secondary | ICD-10-CM | POA: Diagnosis not present

## 2017-04-05 DIAGNOSIS — F329 Major depressive disorder, single episode, unspecified: Secondary | ICD-10-CM | POA: Diagnosis not present

## 2017-04-05 DIAGNOSIS — R443 Hallucinations, unspecified: Secondary | ICD-10-CM | POA: Diagnosis not present

## 2017-04-05 DIAGNOSIS — F0280 Dementia in other diseases classified elsewhere without behavioral disturbance: Secondary | ICD-10-CM | POA: Diagnosis not present

## 2017-04-06 DIAGNOSIS — R443 Hallucinations, unspecified: Secondary | ICD-10-CM | POA: Diagnosis not present

## 2017-04-06 DIAGNOSIS — F0280 Dementia in other diseases classified elsewhere without behavioral disturbance: Secondary | ICD-10-CM | POA: Diagnosis not present

## 2017-04-06 DIAGNOSIS — G301 Alzheimer's disease with late onset: Secondary | ICD-10-CM | POA: Diagnosis not present

## 2017-04-06 DIAGNOSIS — F329 Major depressive disorder, single episode, unspecified: Secondary | ICD-10-CM | POA: Diagnosis not present

## 2017-04-06 DIAGNOSIS — R269 Unspecified abnormalities of gait and mobility: Secondary | ICD-10-CM | POA: Diagnosis not present

## 2017-04-06 DIAGNOSIS — I10 Essential (primary) hypertension: Secondary | ICD-10-CM | POA: Diagnosis not present

## 2017-04-08 DIAGNOSIS — R269 Unspecified abnormalities of gait and mobility: Secondary | ICD-10-CM | POA: Diagnosis not present

## 2017-04-08 DIAGNOSIS — F0280 Dementia in other diseases classified elsewhere without behavioral disturbance: Secondary | ICD-10-CM | POA: Diagnosis not present

## 2017-04-08 DIAGNOSIS — Z23 Encounter for immunization: Secondary | ICD-10-CM | POA: Diagnosis not present

## 2017-04-08 DIAGNOSIS — I10 Essential (primary) hypertension: Secondary | ICD-10-CM | POA: Diagnosis not present

## 2017-04-08 DIAGNOSIS — F329 Major depressive disorder, single episode, unspecified: Secondary | ICD-10-CM | POA: Diagnosis not present

## 2017-04-08 DIAGNOSIS — G301 Alzheimer's disease with late onset: Secondary | ICD-10-CM | POA: Diagnosis not present

## 2017-04-08 DIAGNOSIS — R443 Hallucinations, unspecified: Secondary | ICD-10-CM | POA: Diagnosis not present

## 2017-04-09 DIAGNOSIS — R269 Unspecified abnormalities of gait and mobility: Secondary | ICD-10-CM | POA: Diagnosis not present

## 2017-04-09 DIAGNOSIS — F329 Major depressive disorder, single episode, unspecified: Secondary | ICD-10-CM | POA: Diagnosis not present

## 2017-04-09 DIAGNOSIS — I10 Essential (primary) hypertension: Secondary | ICD-10-CM | POA: Diagnosis not present

## 2017-04-09 DIAGNOSIS — R443 Hallucinations, unspecified: Secondary | ICD-10-CM | POA: Diagnosis not present

## 2017-04-09 DIAGNOSIS — G301 Alzheimer's disease with late onset: Secondary | ICD-10-CM | POA: Diagnosis not present

## 2017-04-09 DIAGNOSIS — F0280 Dementia in other diseases classified elsewhere without behavioral disturbance: Secondary | ICD-10-CM | POA: Diagnosis not present

## 2017-04-12 ENCOUNTER — Telehealth: Payer: Self-pay | Admitting: Neurology

## 2017-04-12 DIAGNOSIS — G301 Alzheimer's disease with late onset: Secondary | ICD-10-CM | POA: Diagnosis not present

## 2017-04-12 DIAGNOSIS — I10 Essential (primary) hypertension: Secondary | ICD-10-CM | POA: Diagnosis not present

## 2017-04-12 DIAGNOSIS — R443 Hallucinations, unspecified: Secondary | ICD-10-CM | POA: Diagnosis not present

## 2017-04-12 DIAGNOSIS — R269 Unspecified abnormalities of gait and mobility: Secondary | ICD-10-CM | POA: Diagnosis not present

## 2017-04-12 DIAGNOSIS — F329 Major depressive disorder, single episode, unspecified: Secondary | ICD-10-CM | POA: Diagnosis not present

## 2017-04-12 DIAGNOSIS — F0280 Dementia in other diseases classified elsewhere without behavioral disturbance: Secondary | ICD-10-CM | POA: Diagnosis not present

## 2017-04-12 NOTE — Telephone Encounter (Signed)
Amy Manson Allan, PT with [facility] [phone], called to let us know she is discharging patient next week. She believes she has reached her max potential, but wanted to let us know she has been noncompliant with performing exercises on her own.   FYI.

## 2017-04-13 DIAGNOSIS — F329 Major depressive disorder, single episode, unspecified: Secondary | ICD-10-CM | POA: Diagnosis not present

## 2017-04-13 DIAGNOSIS — G301 Alzheimer's disease with late onset: Secondary | ICD-10-CM | POA: Diagnosis not present

## 2017-04-13 DIAGNOSIS — R443 Hallucinations, unspecified: Secondary | ICD-10-CM | POA: Diagnosis not present

## 2017-04-13 DIAGNOSIS — I10 Essential (primary) hypertension: Secondary | ICD-10-CM | POA: Diagnosis not present

## 2017-04-13 DIAGNOSIS — R269 Unspecified abnormalities of gait and mobility: Secondary | ICD-10-CM | POA: Diagnosis not present

## 2017-04-13 DIAGNOSIS — F0280 Dementia in other diseases classified elsewhere without behavioral disturbance: Secondary | ICD-10-CM | POA: Diagnosis not present

## 2017-04-15 DIAGNOSIS — F0280 Dementia in other diseases classified elsewhere without behavioral disturbance: Secondary | ICD-10-CM | POA: Diagnosis not present

## 2017-04-15 DIAGNOSIS — I10 Essential (primary) hypertension: Secondary | ICD-10-CM | POA: Diagnosis not present

## 2017-04-15 DIAGNOSIS — R269 Unspecified abnormalities of gait and mobility: Secondary | ICD-10-CM | POA: Diagnosis not present

## 2017-04-15 DIAGNOSIS — R443 Hallucinations, unspecified: Secondary | ICD-10-CM | POA: Diagnosis not present

## 2017-04-15 DIAGNOSIS — F329 Major depressive disorder, single episode, unspecified: Secondary | ICD-10-CM | POA: Diagnosis not present

## 2017-04-15 DIAGNOSIS — G301 Alzheimer's disease with late onset: Secondary | ICD-10-CM | POA: Diagnosis not present

## 2017-04-20 DIAGNOSIS — R443 Hallucinations, unspecified: Secondary | ICD-10-CM | POA: Diagnosis not present

## 2017-04-20 DIAGNOSIS — F329 Major depressive disorder, single episode, unspecified: Secondary | ICD-10-CM | POA: Diagnosis not present

## 2017-04-20 DIAGNOSIS — I10 Essential (primary) hypertension: Secondary | ICD-10-CM | POA: Diagnosis not present

## 2017-04-20 DIAGNOSIS — F0280 Dementia in other diseases classified elsewhere without behavioral disturbance: Secondary | ICD-10-CM | POA: Diagnosis not present

## 2017-04-20 DIAGNOSIS — R269 Unspecified abnormalities of gait and mobility: Secondary | ICD-10-CM | POA: Diagnosis not present

## 2017-04-20 DIAGNOSIS — G301 Alzheimer's disease with late onset: Secondary | ICD-10-CM | POA: Diagnosis not present

## 2017-07-01 DIAGNOSIS — G3109 Other frontotemporal dementia: Secondary | ICD-10-CM | POA: Diagnosis not present

## 2017-07-01 DIAGNOSIS — E039 Hypothyroidism, unspecified: Secondary | ICD-10-CM | POA: Diagnosis not present

## 2017-07-01 DIAGNOSIS — M8580 Other specified disorders of bone density and structure, unspecified site: Secondary | ICD-10-CM | POA: Diagnosis not present

## 2017-07-01 DIAGNOSIS — F322 Major depressive disorder, single episode, severe without psychotic features: Secondary | ICD-10-CM | POA: Diagnosis not present

## 2017-07-01 DIAGNOSIS — R32 Unspecified urinary incontinence: Secondary | ICD-10-CM | POA: Diagnosis not present

## 2017-07-01 DIAGNOSIS — N6019 Diffuse cystic mastopathy of unspecified breast: Secondary | ICD-10-CM | POA: Diagnosis not present

## 2017-07-01 DIAGNOSIS — F0280 Dementia in other diseases classified elsewhere without behavioral disturbance: Secondary | ICD-10-CM | POA: Diagnosis not present

## 2017-07-01 DIAGNOSIS — Z0000 Encounter for general adult medical examination without abnormal findings: Secondary | ICD-10-CM | POA: Diagnosis not present

## 2017-07-01 DIAGNOSIS — R7303 Prediabetes: Secondary | ICD-10-CM | POA: Diagnosis not present

## 2017-07-01 DIAGNOSIS — I779 Disorder of arteries and arterioles, unspecified: Secondary | ICD-10-CM | POA: Diagnosis not present

## 2017-07-01 DIAGNOSIS — N3281 Overactive bladder: Secondary | ICD-10-CM | POA: Diagnosis not present

## 2017-07-01 DIAGNOSIS — I10 Essential (primary) hypertension: Secondary | ICD-10-CM | POA: Diagnosis not present

## 2017-07-01 DIAGNOSIS — E7800 Pure hypercholesterolemia, unspecified: Secondary | ICD-10-CM | POA: Diagnosis not present

## 2017-08-10 ENCOUNTER — Telehealth: Payer: Self-pay | Admitting: Neurology

## 2017-08-10 NOTE — Telephone Encounter (Signed)
Pt's spouse called and wants to know if Dr Tat can prescribe something for pt to sleep at night or should he call there PCP for that, please advise

## 2017-08-10 NOTE — Telephone Encounter (Signed)
Called Mr. Marciel back and he was out.  Informed his wife that we are working on our notes and that we would get back to them as soon as possible but that it may be tomorrow morning.  Patient said that she would relay the message to her husband.

## 2017-08-10 NOTE — Telephone Encounter (Signed)
I haven't seen her since august.  Would like a visit before that.  I know that we r/s her visit but thought that we offered her an earlier visit in Jan and looks like they opted for a later visit in March instead

## 2017-08-10 NOTE — Telephone Encounter (Signed)
Pt's spouse called and said he has not heard back from anyone regarding previous message and he said he was very dissatisfied with [place] about this

## 2017-08-11 NOTE — Telephone Encounter (Signed)
Spoke with patient's spouse. Made aware she would need to see her before prescribing medication. They did contact PCP and they recommended Melatonin. I advised them to try this. I offered appt for next week but they can not "do mornings". They were put on cancellation list.

## 2017-08-30 DIAGNOSIS — E039 Hypothyroidism, unspecified: Secondary | ICD-10-CM | POA: Diagnosis not present

## 2017-09-09 ENCOUNTER — Ambulatory Visit: Payer: Medicare Other | Admitting: Neurology

## 2017-09-22 ENCOUNTER — Telehealth: Payer: Self-pay | Admitting: Neurology

## 2017-09-22 MED ORDER — DONEPEZIL HCL 10 MG PO TABS: 10.0000 mg | ORAL_TABLET | Freq: Every day | ORAL | 1 refills | Status: DC

## 2017-09-22 NOTE — Telephone Encounter (Signed)
Pt needs a prescription called in to [facility] for Paroxetine

## 2017-09-22 NOTE — Telephone Encounter (Signed)
Spoke with patient's husband - made aware we do not prescribe Paroxetine.   [name] husband states that is not the medication he called about. She needs a refill of Donepezil, which Dr. Carles Collet does prescribe. He is very frustrated because he took the time to spell out the medication. [place].   RX for donepezil sent to the pharmacy. Patient has follow up next week.
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
## 2017-10-01 ENCOUNTER — Ambulatory Visit (INDEPENDENT_AMBULATORY_CARE_PROVIDER_SITE_OTHER): Payer: Medicare Other | Admitting: Neurology

## 2017-10-01 ENCOUNTER — Encounter: Payer: Self-pay | Admitting: Neurology

## 2017-10-01 VITALS — BP 118/62 | HR 62

## 2017-10-01 DIAGNOSIS — W19XXXD Unspecified fall, subsequent encounter: Secondary | ICD-10-CM | POA: Diagnosis not present

## 2017-10-01 DIAGNOSIS — F0280 Dementia in other diseases classified elsewhere without behavioral disturbance: Secondary | ICD-10-CM | POA: Diagnosis not present

## 2017-10-01 DIAGNOSIS — G3183 Dementia with Lewy bodies: Secondary | ICD-10-CM | POA: Diagnosis not present

## 2017-10-01 DIAGNOSIS — R441 Visual hallucinations: Secondary | ICD-10-CM

## 2017-10-01 MED ORDER — MEMANTINE HCL 28 X 5 MG & 21 X 10 MG PO TABS: ORAL_TABLET | ORAL | 12 refills | Status: DC

## 2017-10-01 MED ORDER — MEMANTINE HCL 10 MG PO TABS: 10.0000 mg | ORAL_TABLET | Freq: Two times a day (BID) | ORAL | 1 refills | Status: DC

## 2017-10-01 NOTE — Patient Instructions (Addendum)
1.  Start namenda as is prescribed in the starter pack.  After the starter pack is complete, you will take namenda 10 mg twice per day. Starter pack sent to [facility], RX for Namenda 10 mg sent to [facility].   2. Let us know if you want a prescription for a wheelchair

## 2017-10-04 ENCOUNTER — Telehealth: Payer: Self-pay | Admitting: Neurology

## 2017-10-04 DIAGNOSIS — G3183 Dementia with Lewy bodies: Secondary | ICD-10-CM

## 2017-10-04 DIAGNOSIS — F0280 Dementia in other diseases classified elsewhere without behavioral disturbance: Secondary | ICD-10-CM

## 2017-10-04 NOTE — Telephone Encounter (Signed)
Probably RX since regular chair.  Try that first.

## 2017-10-04 NOTE — Telephone Encounter (Signed)
[name] husband made aware we can write RX. He asked for this to be mailed. RX mailed to patient for wheelchair.

## 2017-10-04 NOTE — Telephone Encounter (Signed)
Does patient need to be connected with Debbie or just have a prescription for a wheelchair?

## 2017-10-04 NOTE — Telephone Encounter (Signed)
[name] husband called and said that she is needing a prescription for a Wheelchair. Please Call. Thanks

## 2017-10-05 ENCOUNTER — Telehealth: Payer: Self-pay | Admitting: Psychology

## 2017-10-05 NOTE — Telephone Encounter (Signed)
   Information provided to patient's husband    [facility] [place] in [place]   [email or web address]  Phone: [phone]  What is PACE?  PACE is an acronym for Program of All-inclusive Care for the Elderly. PACE coordinates the care of each participant enrolled in the program based on his or her individual needs. PACE helps individuals continue to live in the community through comprehensive services and support. The [place] (adult day health, medical and rehabilitation services) is open, Monday through Friday, 8 am to 5 pm.  What services are available?  Services include, but are not limited to: [facility] Kitchen Primary Care (including physician and nursing services) . Hospital Care, Emergency Services and Outpatient Services . Medical Specialty Services, Dentistry, Surgical . Prescription Drugs, Over-the-counter medicines and Medical Supplies . Medical Equipment (wheelchairs, walkers, etc.) . Home Care Services, Caregiver Support . Physical, Occupational and Speech Therapies . Adult [place] . Recreational Therapy, Activities and Exercise . Nutritional Counseling . Social Work Charity fundraiser, Conservation officer, nature . Social Services . Transportation to [employer] and medical appointments . End-of-Life Services Note: Most services occur at the [facility], however, some are provided through [facility] provider network and other services occur at the participant's home.    Who is eligible for enrollment?  A person is eligible for enrollment if he or she: . Is 54 years of age or older . Lives in [place] or [place] counties . Meets the nursing home level of care requirement as determined by the [facility] of Medical Assistance. . Is able to live safely in the community at the time of enrollment.

## 2017-10-05 NOTE — Telephone Encounter (Signed)
Telephone call to patient's husband to provide resources in [place].  In addition, the patient's husband had left a message yesterday about a   wheelchair that was ordered for him at their last visit.  In my message, I indicated that Luvenia Starch would call him back in about the wheelchair and that I needed to speak with him about resources for his wife.

## 2017-10-29 DIAGNOSIS — E039 Hypothyroidism, unspecified: Secondary | ICD-10-CM | POA: Diagnosis not present

## 2018-02-09 DIAGNOSIS — E039 Hypothyroidism, unspecified: Secondary | ICD-10-CM | POA: Diagnosis not present

## 2018-02-09 DIAGNOSIS — F0280 Dementia in other diseases classified elsewhere without behavioral disturbance: Secondary | ICD-10-CM | POA: Diagnosis not present

## 2018-02-09 DIAGNOSIS — I10 Essential (primary) hypertension: Secondary | ICD-10-CM | POA: Diagnosis not present

## 2018-02-09 DIAGNOSIS — F322 Major depressive disorder, single episode, severe without psychotic features: Secondary | ICD-10-CM | POA: Diagnosis not present

## 2018-02-09 DIAGNOSIS — E7800 Pure hypercholesterolemia, unspecified: Secondary | ICD-10-CM | POA: Diagnosis not present

## 2018-02-09 DIAGNOSIS — R7303 Prediabetes: Secondary | ICD-10-CM | POA: Diagnosis not present

## 2018-03-22 ENCOUNTER — Telehealth: Payer: Self-pay | Admitting: Neurology

## 2018-03-22 MED ORDER — DONEPEZIL HCL 10 MG PO TABS: 10.0000 mg | ORAL_TABLET | Freq: Every day | ORAL | 1 refills | Status: DC

## 2018-03-22 NOTE — Telephone Encounter (Signed)
[name] husband called and stated she needed a refill on her Donepezil 10 mg prescription. She usually gets a 90 day supply through the [employer]. If you need to call him back it's [phone]. Thanks.

## 2018-03-22 NOTE — Telephone Encounter (Signed)
RX sent to pharmacy  
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
## 2018-04-05 ENCOUNTER — Encounter: Payer: Self-pay | Admitting: Neurology

## 2018-04-05 ENCOUNTER — Ambulatory Visit (INDEPENDENT_AMBULATORY_CARE_PROVIDER_SITE_OTHER): Payer: Medicare Other | Admitting: Neurology

## 2018-04-05 VITALS — BP 144/84 | HR 72

## 2018-04-05 DIAGNOSIS — G3183 Dementia with Lewy bodies: Secondary | ICD-10-CM

## 2018-04-05 DIAGNOSIS — G3109 Other frontotemporal dementia: Secondary | ICD-10-CM

## 2018-04-05 DIAGNOSIS — F0280 Dementia in other diseases classified elsewhere without behavioral disturbance: Secondary | ICD-10-CM | POA: Diagnosis not present

## 2018-04-05 DIAGNOSIS — Z23 Encounter for immunization: Secondary | ICD-10-CM | POA: Diagnosis not present

## 2018-04-05 MED ORDER — AMBULATORY NON FORMULARY MEDICATION: 0 refills | Status: DC

## 2018-04-05 NOTE — Patient Instructions (Signed)
1.  Stop the memantine 2.  You were given a RX for a [employer] 3.  I will send a referral to Dr. Manuella Ghazi at [facility]

## 2018-04-07 ENCOUNTER — Telehealth: Payer: Self-pay | Admitting: Neurology

## 2018-04-07 NOTE — Telephone Encounter (Signed)
Referral faxed to [facility] Neu[facility] to New patient coordinator for Dr. Manuella Ghazi. They will call patient to schedule appt. Fax number [phone] with confirmation received.

## 2018-04-13 ENCOUNTER — Telehealth: Payer: Self-pay | Admitting: Neurology

## 2018-04-13 NOTE — Telephone Encounter (Signed)
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 It was changed in the chart. I verified this diagnosis with Dr. Carles Collet.

## 2018-04-13 NOTE — Telephone Encounter (Signed)
[name] husband called and wanted to know what Sharon's Dx is? He said at the last Appt with Dr. Carles Collet She had Frontal Lobe Dementia but on the prescription for the lift it said Lewy Body Dementia? Please Call

## 2018-04-14 ENCOUNTER — Ambulatory Visit (INDEPENDENT_AMBULATORY_CARE_PROVIDER_SITE_OTHER): Payer: Medicare Other | Admitting: Family Medicine

## 2018-04-14 ENCOUNTER — Other Ambulatory Visit: Payer: Self-pay

## 2018-04-14 ENCOUNTER — Encounter: Payer: Self-pay | Admitting: Family Medicine

## 2018-04-14 VITALS — BP 124/62 | HR 81 | Temp 97.6°F

## 2018-04-14 DIAGNOSIS — F0390 Unspecified dementia without behavioral disturbance: Secondary | ICD-10-CM | POA: Insufficient documentation

## 2018-04-14 DIAGNOSIS — I10 Essential (primary) hypertension: Secondary | ICD-10-CM

## 2018-04-14 DIAGNOSIS — I779 Disorder of arteries and arterioles, unspecified: Secondary | ICD-10-CM | POA: Diagnosis not present

## 2018-04-14 DIAGNOSIS — E7800 Pure hypercholesterolemia, unspecified: Secondary | ICD-10-CM | POA: Diagnosis not present

## 2018-04-14 DIAGNOSIS — G3109 Other frontotemporal dementia: Secondary | ICD-10-CM

## 2018-04-14 DIAGNOSIS — R7303 Prediabetes: Secondary | ICD-10-CM | POA: Insufficient documentation

## 2018-04-14 DIAGNOSIS — M8580 Other specified disorders of bone density and structure, unspecified site: Secondary | ICD-10-CM | POA: Insufficient documentation

## 2018-04-14 DIAGNOSIS — F329 Major depressive disorder, single episode, unspecified: Secondary | ICD-10-CM | POA: Insufficient documentation

## 2018-04-14 DIAGNOSIS — F0280 Dementia in other diseases classified elsewhere without behavioral disturbance: Secondary | ICD-10-CM | POA: Diagnosis not present

## 2018-04-14 DIAGNOSIS — N6019 Diffuse cystic mastopathy of unspecified breast: Secondary | ICD-10-CM | POA: Insufficient documentation

## 2018-04-14 DIAGNOSIS — F331 Major depressive disorder, recurrent, moderate: Secondary | ICD-10-CM | POA: Diagnosis not present

## 2018-04-14 DIAGNOSIS — E039 Hypothyroidism, unspecified: Secondary | ICD-10-CM | POA: Diagnosis not present

## 2018-04-14 DIAGNOSIS — N3281 Overactive bladder: Secondary | ICD-10-CM | POA: Insufficient documentation

## 2018-04-14 DIAGNOSIS — I739 Peripheral vascular disease, unspecified: Secondary | ICD-10-CM
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
## 2018-04-15 NOTE — Assessment & Plan Note (Signed)
On no medications Given her advanced dementia, it is unlikely that this will need to be treated unless she develops wildly uncontrolled diabetes We will review labs from previous PCP when they are available

## 2018-04-15 NOTE — Assessment & Plan Note (Signed)
Chronic and well-controlled currently As above, I am concerned that the anticholinergic effects of Paxil may be worsening her dementia I would like to stop this and start a different SSRI At next visit, will discuss again with patient's husband and caregiver

## 2018-04-15 NOTE — Assessment & Plan Note (Signed)
Well-controlled Continue lisinopril 10 mg daily We will review recent labs from PCP when records are available No labs today Follow-up in 6 months

## 2018-04-15 NOTE — Assessment & Plan Note (Signed)
As above, I do not believe the patient will get much benefit from statin and baby aspirin for primary prevention and is more likely to suffer side effects or other harm We will reassess at next visit whether husband is ready to stop some of these medications We will review recent labs from previous PCP when records are available

## 2018-04-15 NOTE — Assessment & Plan Note (Signed)
Chronic and well controlled Continue Synthroid 50 mcg daily We will review recent labs from PCP when records are available No labs today Follow-up in 6 months

## 2018-04-15 NOTE — Assessment & Plan Note (Addendum)
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 Has progressed fairly quickly She is now mostly nonverbal, nonambulatory, and reliant on her husband and caretaker for all ADLs Discussed with patient and her husband that given her advanced dementia, is unlikely that she will get benefit from some health maintenance interventions, such as cancer screenings She also is unlikely to receive benefit from baby aspirin and statin for primary prevention Also discussed that Paxil can be quite anticholinergic and can worsen dementia/delirium Patient and her husband are not ready to change medications at this time, but we will reassess in 6 months Would like to update vaccinations as these may have a positive impact on her morbidity/mortality in the future-will review previous PCP records and update as needed

## 2018-05-12 ENCOUNTER — Telehealth: Payer: Self-pay | Admitting: Family Medicine

## 2018-05-12 NOTE — Telephone Encounter (Signed)
The family is requesting palliative care in the home.  Hospice did fax the paperwork for palliative care and would like it completed and faxed back to them if approved.  Thanks [employer]

## 2018-05-12 NOTE — Telephone Encounter (Signed)
Form completed.  There is a list of other things that need to be faxed back with this including demographics, insurance information, and last office visit note  Avon Mergenthaler, Dionne Bucy, MD, MPH [facility] 05/12/2018 4:15 PM

## 2018-05-13 NOTE — Telephone Encounter (Signed)
[facility] faxed form and additional medical records that were requested.

## 2018-05-25 ENCOUNTER — Telehealth: Payer: Self-pay | Admitting: Family Medicine

## 2018-05-25 NOTE — Telephone Encounter (Signed)
See Rx  Brita Romp Dionne Bucy, MD, MPH [facility] 05/25/2018 3:05 PM

## 2018-05-25 NOTE — Telephone Encounter (Signed)
Order faxed to Hospice

## 2018-05-25 NOTE — Telephone Encounter (Signed)
Hospice is asking for a hospital bed for patient.   Can we fax an order to [phone]  Husband is having a hard time getting wife in and out of bed  Thanks  teri

## 2018-05-26 ENCOUNTER — Other Ambulatory Visit (INDEPENDENT_AMBULATORY_CARE_PROVIDER_SITE_OTHER): Payer: Medicare Other

## 2018-05-26 ENCOUNTER — Telehealth: Payer: Self-pay

## 2018-05-26 DIAGNOSIS — R8290 Unspecified abnormal findings in urine: Secondary | ICD-10-CM

## 2018-05-26 DIAGNOSIS — R3989 Other symptoms and signs involving the genitourinary system: Secondary | ICD-10-CM

## 2018-05-26 LAB — POCT URINALYSIS DIPSTICK
Bilirubin, UA: NEGATIVE
Glucose, UA: NEGATIVE
KETONES UA: NEGATIVE
Nitrite, UA: POSITIVE
PH UA: 6 (ref 5.0–8.0)
Protein, UA: POSITIVE — AB
RBC UA: NEGATIVE
SPEC GRAV UA: 1.02 (ref 1.010–1.025)
UROBILINOGEN UA: 0.2 U/dL

## 2018-05-26 NOTE — Telephone Encounter (Signed)
Per Dr. Brita Romp okay for patient's husband to drop off urine sample.

## 2018-05-26 NOTE — Telephone Encounter (Signed)
Patient advised as below.  

## 2018-05-26 NOTE — Telephone Encounter (Signed)
[name] husband called for advice, patient may be having a UTI. They have collected urine and want to know if they can just drop it off.  CB# [phone]

## 2018-05-27 ENCOUNTER — Other Ambulatory Visit: Payer: Self-pay | Admitting: Family Medicine

## 2018-05-27 MED ORDER — CEPHALEXIN 500 MG PO CAPS: 500.0000 mg | ORAL_CAPSULE | Freq: Two times a day (BID) | ORAL | 0 refills | Status: AC

## 2018-05-30 LAB — URINE CULTURE

## 2018-06-21 ENCOUNTER — Other Ambulatory Visit (INDEPENDENT_AMBULATORY_CARE_PROVIDER_SITE_OTHER): Payer: Medicare Other

## 2018-06-21 ENCOUNTER — Telehealth: Payer: Self-pay | Admitting: Family Medicine

## 2018-06-21 DIAGNOSIS — R8290 Unspecified abnormal findings in urine: Secondary | ICD-10-CM | POA: Diagnosis not present

## 2018-06-21 LAB — POCT URINALYSIS DIPSTICK
Bilirubin, UA: NEGATIVE
Glucose, UA: NEGATIVE
Ketones, UA: NEGATIVE
NITRITE UA: POSITIVE
Protein, UA: NEGATIVE
RBC UA: NEGATIVE
SPEC GRAV UA: 1.02 (ref 1.010–1.025)
UROBILINOGEN UA: 0.2 U/dL
pH, UA: 6 (ref 5.0–8.0)

## 2018-06-21 NOTE — Telephone Encounter (Signed)
Yes that's fine  Virginia Crews, MD, MPH [facility] 06/21/2018 9:43 AM

## 2018-06-21 NOTE — Telephone Encounter (Signed)
Pt husband thinks Christina [name] may have another UTI.  Wants to drop urine by to have tested.   Is this ok?   He has the hat and urine specimen bottles. He would like to know ASAP because he has appt with atty in [place] at 10 and would like to drop this by before mtg.

## 2018-06-21 NOTE — Telephone Encounter (Signed)
UA done and sent for culture

## 2018-06-22 ENCOUNTER — Other Ambulatory Visit: Payer: Self-pay | Admitting: Family Medicine

## 2018-06-22 MED ORDER — CEPHALEXIN 500 MG PO CAPS: 500.0000 mg | ORAL_CAPSULE | Freq: Two times a day (BID) | ORAL | 0 refills | Status: AC

## 2018-06-24 ENCOUNTER — Telehealth: Payer: Self-pay

## 2018-06-24 LAB — URINE CULTURE

## 2018-06-24 NOTE — Telephone Encounter (Signed)
Left message for patient's husband Gene to return call.

## 2018-06-24 NOTE — Telephone Encounter (Signed)
-----   Message from Virginia Crews, MD sent at 06/24/2018  4:43 PM EST ----- Urine culture confirms UTI that is sensitive to abx prescribed.  Hope she is doing better.

## 2018-06-28 ENCOUNTER — Other Ambulatory Visit: Payer: Medicare Other

## 2018-06-28 DIAGNOSIS — F0280 Dementia in other diseases classified elsewhere without behavioral disturbance: Secondary | ICD-10-CM

## 2018-06-28 DIAGNOSIS — G3109 Other frontotemporal dementia: Principal | ICD-10-CM

## 2018-06-28 NOTE — Telephone Encounter (Signed)
[name] husband Gene advised.

## 2018-06-29 NOTE — Progress Notes (Signed)
PATIENT NAME: Christina [name] DOB: [birth year] MRN: [id number]  PRIMARY CARE PROVIDER: Virginia Crews, MD  RESPONSIBLE PARTY:  Acct ID - Guarantor Home Phone Work Phone Relationship Acct Type  [id number] - [place],[name]  Self P/F     [place], [place] [address]    PLAN OF CARE and INTERVENTIONS:               1.  GOALS OF CARE/ ADVANCE CARE PLANNING: Husband wants patient to remain at home for as long as he can manage patient's care.               2.  PATIENT/CAREGIVER EDUCATION:  Education on disease progression               4. PERSONAL EMERGENCY PLAN: In Place               5.  DISEASE STATUS Frontal Lobe Dementia    HISTORY OF PRESENT ILLNESS:    CODE STATUS: UnKnown  ADVANCED DIRECTIVES:Y MOST FORM: Unknown PPS: 30%   PHYSICAL EXAM:   VITALS: Today's Vitals   06/28/18 1824  BP: 128/70  Pulse: 94  Resp: 16  SpO2: 95%  PainSc: 0-No pain    LUNGS: clear to auscultation   CARDIAC: Cor RRR  EXTREMITIES: none edema SKIN: Skin color, texture, turgor normal. No rashes or lesions  NEURO: positive for memory problems                        Patient is a 67 year old female with frontal temporal lobe dementia.  RN met with patient's husband Christina [name] who is patient's primary caregiver. Patient sitting in chair and husband is feeding patient soup.  Patient smiles and speaks a few words during visit.  No s/s of pain or discomfort observed in patient.  Patient is dependant for all ADL's.  Husband reports patient's hospital bed was delivered this week.  Husband reports patient can not support weight any longer and he feels he will need to get a hoyer lift to transfer patient soon.  Husband has hired caregivers that comes in 3 times per day to get patient in and put of bed and provide incontinent care to patient.  [name] disease is progressing. Support provided to husband.  Husband informed to call Palliative Care team with questions or  concerns.      Christina Simmer, RN

## 2018-07-05 ENCOUNTER — Telehealth: Payer: Self-pay

## 2018-07-05 NOTE — Telephone Encounter (Signed)
Received call from patient's husband Gene requesting information on hoyer left. Gene reports they received a prescription for the lift from Dr. Manuella Ghazi some time ago, but havent felt like patient needed it until recently. Gene questioning whether to inquire about buying a used fully electric lift, or what insurance would cover through renting hydraulic lift. Spoke with representative at [employer] where patient receives hospital bed through, and was informed that insurance will not cover electric lift unless patient is over 450 lbs and received monthly rental rate of hydraulic life depending on insurance coverage. Relayed this information back to Gene who states he will contact this SW or [employer] RN to notify how he would like to proceed. Answered questions about disease progression and provided education and information on hospice eligibility per Gene's request.

## 2018-07-25 DIAGNOSIS — E039 Hypothyroidism, unspecified: Secondary | ICD-10-CM | POA: Diagnosis not present

## 2018-07-25 DIAGNOSIS — I779 Disorder of arteries and arterioles, unspecified: Secondary | ICD-10-CM | POA: Diagnosis not present

## 2018-07-25 DIAGNOSIS — F0280 Dementia in other diseases classified elsewhere without behavioral disturbance: Secondary | ICD-10-CM | POA: Diagnosis not present

## 2018-07-25 DIAGNOSIS — E7800 Pure hypercholesterolemia, unspecified: Secondary | ICD-10-CM | POA: Diagnosis not present

## 2018-08-07 DIAGNOSIS — E039 Hypothyroidism, unspecified: Secondary | ICD-10-CM | POA: Diagnosis not present

## 2018-08-07 DIAGNOSIS — E7800 Pure hypercholesterolemia, unspecified: Secondary | ICD-10-CM | POA: Diagnosis not present

## 2018-08-07 DIAGNOSIS — I779 Disorder of arteries and arterioles, unspecified: Secondary | ICD-10-CM | POA: Diagnosis not present

## 2018-08-07 DIAGNOSIS — F0280 Dementia in other diseases classified elsewhere without behavioral disturbance: Secondary | ICD-10-CM | POA: Diagnosis not present

## 2018-08-07 DIAGNOSIS — G3183 Dementia with Lewy bodies: Secondary | ICD-10-CM | POA: Diagnosis not present

## 2018-08-09 ENCOUNTER — Other Ambulatory Visit: Payer: PPO

## 2018-08-09 DIAGNOSIS — G3109 Other frontotemporal dementia: Principal | ICD-10-CM

## 2018-08-09 DIAGNOSIS — F0280 Dementia in other diseases classified elsewhere without behavioral disturbance: Secondary | ICD-10-CM

## 2018-08-09 NOTE — Progress Notes (Signed)
  PATIENT NAME: Christina [name] DOB: 12-Nov-1950 MRN: [id number]  PRIMARY CARE PROVIDER: Virginia Crews, MD  RESPONSIBLE PARTY:  Acct ID - Guarantor Home Phone Work Phone Relationship Acct Type  [id number] - [place],[name]  Self P/F     [place], [place] [address]    PLAN OF CARE and INTERVENTIONS:               1.  GOALS OF CARE/ ADVANCE CARE PLANNING:  Prevent aspiration, Remain at home for as long as husband can manage care, Education on disease progression.                2.  PATIENT/CAREGIVER EDUCATION:  Education on diet and aspiration precautions, Education to husband on dementia progression.                4. PERSONAL EMERGENCY PLAN:  In Place               5.  DISEASE STATUS: Patient coughing with eating, husband reports patient is sleeping 14-16 hours per day. Total care.     HISTORY OF PRESENT ILLNESS:    CODE STATUS:  Unknown ADVANCED DIRECTIVES: Unknown MOST FORM: Unknown PPS: 30%   PHYSICAL EXAM:   VITALS:See Vitals  LUNGS: Decreased breath sounds on the bases, clear in upper lobes CARDIAC: Regular rate and rhythm EXTREMITIES: No  SKIN: No edema to extremities, No open areas of skin breakdown  NEURO: positive for memory problems  Palliative Care visit made to patient's home with SW [name].  Husband talks at length about patient's status and length of time it takes patient to eat.  Husband reports it takes 1.5 hours to feed patient meals.  Husband requested information on diet patient should be on as patient is having difficulty swallowing foods.  Education to husband to chop patient's meats, vegetables and fruits.  Patient is dependent for all ADL's.  Husband has caregivers who come to home 3 times daily to bathe, change and get patient out of bed and put patient to bed.  Patient no longer able to stand and pivot.  Husband and caregivers have been using hoyer lift to transfer patient.  Husband reports patient's appetite is fair.  Husband  reports he has been going patient a nutritional shakes for 1 meal a few times per week.  Husband asks if patient needs to continue to see neurologist.  SW and RN inform husband due to stage of patient's dementia seeing neurologist is not really necessary.  Husband states it is hard to get patient to MD appointments.  Husband tearful and states he gets frustrated at times with patient.  Support provided to husband.  Husband informed to contact Palliative Care team with questions or concerns.                 Nilda Simmer, RN

## 2018-08-10 NOTE — Progress Notes (Signed)
COMMUNITY PALLIATIVE CARE SW NOTE  PATIENT NAME: Christina [name] DOB: Feb 21, 1951 MRN: [id number]  PRIMARY CARE PROVIDER: Virginia Crews, MD  RESPONSIBLE PARTY:  Acct ID - Guarantor Home Phone Work Phone Relationship Acct Type  [id number] - Fossett,[phone]  Self P/F     [place], [place], [place] [address]     PLAN OF CARE and INTERVENTIONS:             1. GOALS OF CARE/ ADVANCE CARE PLANNING:  Ongoing discussions with patient's husband regarding patient's wishes. Provided blank copies of DNR and MOST form for his review. Will continue to follow-up.  2. SOCIAL/EMOTIONAL/SPIRITUAL ASSESSMENT/ INTERVENTIONS:  Patient continues to be alert, but disoriented. Is pleasant and can smile when spoken to or she is smiled at. At times she was able to say one word, or repeat 2-3 words that were spoken to her. Patient did begin to cry when husband got emotional discussing his own emotional well-being. Patient sitting in chair during this visit. Husband and Virgie, RN present as well. Patient continues to have good support from husband, friends and family and church community.  3. PATIENT/CAREGIVER EDUCATION/ COPING:  Ongoing support to patient's husband regarding stage of disease and expected disease progression. Husband continues to be actively involved in patient's care and goes above and beyond to ensure patient's needs are met. Husband does report that he can feel like the caregiving role is "taking a toll on him" and feels like he would like to implement ways to be able to get out of the house more. SW and RN both encouraged self-care and discussed ways he may be able to do this. Much support and validation provided. 4. PERSONAL EMERGENCY PLAN:  [name] husband will call 911 with emergencies. 5. COMMUNITY RESOURCES COORDINATION/ HEALTH CARE NAVIGATION:  Patient continues to show signs of disease progression. She is no longer able to assist with transfers and husband is using hoyer lift. Patient  is having difficulty with swallowing at times and is pocketing food. Although signs of decline noted, patient not appropriate for hospice care at this time, for end stage dementia. Will continue to assess decline with each visit. SW provided husband with names of sitters per his request, to allow him to be away from the home for longer periods of time. 6. FINANCIAL/LEGAL CONCERNS/INTERVENTIONS:  None at this time.  I spent 65 minutes providing consultation and support to patient and family, from 2:00p-3:05p.     SOCIAL HX:  Social History   Tobacco Use  . Smoking status: Never Smoker  . Smokeless tobacco: Never Used  Substance Use Topics  . Alcohol use: Not Currently    Alcohol/week: 0.0 standard drinks    CODE STATUS:   Code Status: Not on file  ADVANCED DIRECTIVES: N MOST FORM COMPLETE:  no HOSPICE EDUCATION PROVIDED: Ongoing education on qualifying for hospice based on end-stage dementia.  YBO:FBPZWCH dependent for all ADLs. Incontinent. Unable to ambulate.       Christina [name]

## 2018-08-12 ENCOUNTER — Telehealth: Payer: Self-pay

## 2018-08-12 NOTE — Telephone Encounter (Signed)
RN received fax from Dr. Johnell Comings to clarify some approved changes in current medication management. Call to husband to discuss fax update. throughly reviewed the medication recommendation changes sent by MD.  Instructions were to continue Levothyroxine, paroxetine (can taper off if not needed), and Lisinopril. OK to discontinue Aricept, Simvastatin, melatonin, Zyrtec and aspirin. Husband asked appropriate questions and write down instructions, verbalized understanding.

## 2018-08-25 DIAGNOSIS — F0280 Dementia in other diseases classified elsewhere without behavioral disturbance: Secondary | ICD-10-CM | POA: Diagnosis not present

## 2018-08-25 DIAGNOSIS — E7800 Pure hypercholesterolemia, unspecified: Secondary | ICD-10-CM | POA: Diagnosis not present

## 2018-08-25 DIAGNOSIS — E039 Hypothyroidism, unspecified: Secondary | ICD-10-CM | POA: Diagnosis not present

## 2018-08-25 DIAGNOSIS — I779 Disorder of arteries and arterioles, unspecified: Secondary | ICD-10-CM | POA: Diagnosis not present

## 2018-08-29 ENCOUNTER — Other Ambulatory Visit: Payer: Self-pay | Admitting: Family Medicine

## 2018-08-29 NOTE — Telephone Encounter (Signed)
[name] husband Gene advised. He agrees to change patient to Zoloft. Pharmacy- [facility].

## 2018-08-29 NOTE — Telephone Encounter (Signed)
Husband and I had discussed possibility of stopping some medications.  I'd recommend stopping this as it can worsen dementia symptoms.  If he is worried about mood, we could use Zoloft instead.  Let me know what he thinks

## 2018-08-29 NOTE — Telephone Encounter (Signed)
[name] husband is requesting a refill on the following medication  PARoxetine (PAXIL) 40 MG tablet   90 day supply  He would like it sent to [facility] mail order pharmacy.

## 2018-08-31 MED ORDER — SERTRALINE HCL 100 MG PO TABS: 100.0000 mg | ORAL_TABLET | Freq: Every day | ORAL | 3 refills | Status: DC

## 2018-08-31 NOTE — Telephone Encounter (Signed)
OK. Stop Paxil and Start Zoloft 100mg  daily. Rx sent to pharmacy.

## 2018-09-07 DIAGNOSIS — E7800 Pure hypercholesterolemia, unspecified: Secondary | ICD-10-CM | POA: Diagnosis not present

## 2018-09-07 DIAGNOSIS — G3183 Dementia with Lewy bodies: Secondary | ICD-10-CM | POA: Diagnosis not present

## 2018-09-07 DIAGNOSIS — I779 Disorder of arteries and arterioles, unspecified: Secondary | ICD-10-CM | POA: Diagnosis not present

## 2018-09-07 DIAGNOSIS — F0280 Dementia in other diseases classified elsewhere without behavioral disturbance: Secondary | ICD-10-CM | POA: Diagnosis not present

## 2018-09-07 DIAGNOSIS — E039 Hypothyroidism, unspecified: Secondary | ICD-10-CM | POA: Diagnosis not present

## 2018-09-13 ENCOUNTER — Other Ambulatory Visit: Payer: PPO

## 2018-09-13 DIAGNOSIS — Z515 Encounter for palliative care: Secondary | ICD-10-CM

## 2018-09-13 NOTE — Progress Notes (Signed)
PATIENT NAME: Christina [name] DOB: 07/26/1950 MRN: [id number]  PRIMARY CARE PROVIDER: Bacigalupo, Angela M, MD  RESPONSIBLE PARTY:  Acct ID - Guarantor Home Phone Work Phone Relationship Acct Type  [id number] - [name],Christina* [phone]  Self P/F     [address], [place], [place] [address]    PLAN OF CARE and INTERVENTIONS:               1.  GOALS OF CARE/ ADVANCE CARE PLANNING:  Remain at home for as long as husband can manage patient's care                2.  PATIENT/CAREGIVER EDUCATION:  Education on aspiration precautions, good hand washing to reduce infection.                4. PERSONAL EMERGENCY PLAN:  In Place               5.  DISEASE STATUS:[name] dementia is worsening, husband reports it takes patient 1 1/2 hours to feed patient meals.     HISTORY OF PRESENT ILLNESS:    CODE STATUS: Unknown  ADVANCED DIRECTIVES: Y MOST FORM: N PPS: 30%   PHYSICAL EXAM:   VITALS: See Vital signs  LUNGS: clear to auscultation , decreased breath sounds CARDIAC: Cor RRR  EXTREMITIES:  SKIN: Skin color, texture, turgor normal. No rashes or lesions or normal  NEURO: positive for memory problems   Palliative Care visit made with SW Christina [name].  Team met with patient and husband.  Husband reports he stopped patient's Aricept and Simvastatin.  Husband reports he feels patient has been doing better since stopping Aricept and Simvastatin.  Patient smiling and pleasant and more alert today.  Husband reports patient has been having some issues with constipation.  Education to husband to try giving patient Senna S for constipation.  Husband has hired caregiver that comes in 2-3 times per day to get patient up, change patient and put patient to bed.  Patient remains dependent for all ADL's.  Patient has not suffered any falls.  Patient was started on Zoloft 100 mg 1 tab daily.  Husband informed to contact Palliative Care with questions or concerns.                    , RN 

## 2018-09-15 NOTE — Progress Notes (Signed)
COMMUNITY PALLIATIVE CARE SW NOTE  PATIENT NAME: Christina [name] DOB: Jan 28, 1951 MRN: [id number]  PRIMARY CARE PROVIDER: Virginia Crews, MD  RESPONSIBLE PARTY:  Acct ID - Guarantor Home Phone Work Phone Relationship Acct Type  [id number] - Kazmierczak,[name]  Self P/F     [place], [place] [address]     PLAN OF CARE and INTERVENTIONS:             1. GOALS OF CARE/ ADVANCE CARE PLANNING:  No changes in advanced directives. Patient is DNR. 2. SOCIAL/EMOTIONAL/SPIRITUAL ASSESSMENT/ INTERVENTIONS:  Patient sitting in chair in living room upon arrival. Patient is more alert than on previous visits. Continues to smile when smiled at and can repeat 1-2 words when spoken to. Patient able to recall the name of her daughter but unable to identify color of flowers on coffee table. Remains calm and pleasant. Support provided through positive interaction and encouragement.  3. PATIENT/CAREGIVER EDUCATION/ COPING: [name] husband continues to be active in patient's care. He pays attention to small details and implements all suggestions made to aid in the care of patient. SW and nurse continue to encourage self-care for husband as he has his own health issues and has not been getting out of the house for more than grocery store or pharmacy runs, or for medical appointments. Husband agrees that he will work towards having time away for leisure.  4. PERSONAL EMERGENCY PLAN:  Husband will call 911 in case of emergency. 5. COMMUNITY RESOURCES COORDINATION/ HEALTH CARE NAVIGATION:  Husband has hired new private caregivers that he is very pleased with. He states he feels more comfortable leaving patient for extended periods of time with this sitter. Husband also reports that patient seems more alert after stopping some of her meds. He is pleased with this. Still monitoring for signs of decline. Husband is now having to puree patient's food and it takes 1.5 hours to feed her a meal. Will continue to  assess.  No new needs at this time.   6. FINANCIAL/LEGAL CONCERNS/INTERVENTIONS:  None at this time.     SOCIAL HX:  Social History   Tobacco Use  . Smoking status: Never Smoker  . Smokeless tobacco: Never Used  Substance Use Topics  . Alcohol use: Not Currently    Alcohol/week: 0.0 standard drinks    CODE STATUS:   Code Status: Not on file  ADVANCED DIRECTIVES: N MOST FORM COMPLETE:  no HOSPICE EDUCATION PROVIDED: None this visit  GEX:[id number] is total care. Able to sit up in chair and still able to smile.   I spent 60 minutes from 1p-2p providing support and consultation to patient and husband.      [name]

## 2018-09-23 DIAGNOSIS — E7800 Pure hypercholesterolemia, unspecified: Secondary | ICD-10-CM | POA: Diagnosis not present

## 2018-09-23 DIAGNOSIS — I779 Disorder of arteries and arterioles, unspecified: Secondary | ICD-10-CM | POA: Diagnosis not present

## 2018-09-23 DIAGNOSIS — F0280 Dementia in other diseases classified elsewhere without behavioral disturbance: Secondary | ICD-10-CM | POA: Diagnosis not present

## 2018-09-23 DIAGNOSIS — E039 Hypothyroidism, unspecified: Secondary | ICD-10-CM | POA: Diagnosis not present

## 2018-09-27 ENCOUNTER — Other Ambulatory Visit: Payer: Self-pay | Admitting: Family Medicine

## 2018-09-27 MED ORDER — LISINOPRIL 10 MG PO TABS: 10.0000 mg | ORAL_TABLET | Freq: Every day | ORAL | 3 refills | Status: DC

## 2018-09-27 NOTE — Telephone Encounter (Signed)
Pt need refill on Lisinopril 10mg   X 90 days  [facility]  CB#   [phone]  Thanks Con Memos

## 2018-09-27 NOTE — Telephone Encounter (Signed)
Pt's husband called wanting to know why his wifes Rx for Lisinopril was sent to [facility].  Should have been [facility] as listed on the phone message.  He would like for [place] to resend her prescription to [facility] order  Con Memos

## 2018-09-28 MED ORDER — LISINOPRIL 10 MG PO TABS: 10.0000 mg | ORAL_TABLET | Freq: Every day | ORAL | 3 refills | Status: DC

## 2018-09-28 NOTE — Telephone Encounter (Signed)
They changed to [facility] for her prescriptions.  teri

## 2018-09-29 ENCOUNTER — Telehealth: Payer: Self-pay

## 2018-09-29 MED ORDER — CEPHALEXIN 500 MG PO CAPS: 500.0000 mg | ORAL_CAPSULE | Freq: Two times a day (BID) | ORAL | 0 refills | Status: AC

## 2018-09-29 NOTE — Addendum Note (Signed)
Addended by: Virginia Crews on: 09/29/2018 04:07 PM   Modules accepted: Orders

## 2018-09-29 NOTE — Telephone Encounter (Signed)
[name] husband Gene advised.

## 2018-09-29 NOTE — Telephone Encounter (Signed)
Per husband, patient has been taking Azo.  Will send Urine miicro and culture instead of POC UA.  Once urine sample collected, ok to start Keflex empirically.  Rx sent to pharmacy.  Husband notified.  Please change order.

## 2018-09-29 NOTE — Telephone Encounter (Signed)
Patients husband called office with concerns of patient having recurrent UTI. He states that it has been hard in past getting a urine sample and states that Dr. B is aware of this. Patients husband requested to pick up a sterile urine cup to have patients urine tested. Patients husband states that he will be by office today after 3 to pick up cup. Please advise. KW

## 2018-09-29 NOTE — Telephone Encounter (Signed)
Can attempt to get urine sample and drop it off for testing.  If unable to obtain, we can treat empirically.  Last urine culture showed pan-sensitive E coli, so ok to treat with Keflex. Can check back tomorrow and see if he was able to obtain.

## 2018-09-30 ENCOUNTER — Other Ambulatory Visit (INDEPENDENT_AMBULATORY_CARE_PROVIDER_SITE_OTHER): Payer: PPO

## 2018-09-30 DIAGNOSIS — R3989 Other symptoms and signs involving the genitourinary system: Secondary | ICD-10-CM | POA: Diagnosis not present

## 2018-09-30 DIAGNOSIS — R8290 Unspecified abnormal findings in urine: Secondary | ICD-10-CM

## 2018-09-30 LAB — POCT URINALYSIS DIPSTICK
Bilirubin, UA: NEGATIVE
Blood, UA: NEGATIVE
Glucose, UA: NEGATIVE
KETONES UA: NEGATIVE
NITRITE UA: POSITIVE
Protein, UA: POSITIVE — AB
SPEC GRAV UA: 1.015 (ref 1.010–1.025)
Urobilinogen, UA: 0.2 E.U./dL
pH, UA: 6 (ref 5.0–8.0)

## 2018-10-02 LAB — URINE CULTURE

## 2018-10-06 ENCOUNTER — Other Ambulatory Visit: Payer: PPO

## 2018-10-06 ENCOUNTER — Other Ambulatory Visit: Payer: Self-pay

## 2018-10-06 DIAGNOSIS — Z515 Encounter for palliative care: Secondary | ICD-10-CM

## 2018-10-06 NOTE — Progress Notes (Signed)
PATIENT NAME: Christina [name] DOB: 04/08/1951 MRN: [id number]  PRIMARY CARE PROVIDER: Virginia Crews, MD  RESPONSIBLE PARTY:  Acct ID - Guarantor Home Phone Work Phone Relationship Acct Type  [id number] - Bennion,[name]  Self P/F     [place], [place] [address]    PLAN OF CARE and INTERVENTIONS:               1.  GOALS OF CARE/ ADVANCE CARE PLANNING:  Remain in home with husband as long as he can manage patient's care.               2.  PATIENT/CAREGIVER EDUCATION:  Education to husband on signs and symptoms of infection.  Education to husband on importance of taking stool softeners to aide with constipation.                3.  DISEASE STATUS:Patient is a 68 year old patient who resides in her home with husband.  Patient has Alzheimer's disease.      HISTORY OF PRESENT ILLNESS:    CODE STATUS: DNR  ADVANCED DIRECTIVES: Y MOST FORM: No PPS: 30%   PHYSICAL EXAM:   VITALS: See Vital signs  LUNGS: clear to auscultation , decreased breath sounds CARDIAC: Cor RRR  EXTREMITIES: none edema SKIN: Skin color, texture, turgor normal. No rashes or lesions  NEURO: positive for gait problems, memory problems and speech problems  Palliative Care home visit made with SW [name] to patient's home.  Patient sitting in recliner with feet elevated.  [name] husband continues to provide care to patient with caregiver coming in several times a day to assist with changing and bathing patient.  Patient smiling and answers no when RN asks her if she is having any pain.  Patient unable to make needs known and husband states it continues to take 1 - 1 1/2 hours to feed patient her meals. Husband reports patient is eating better.  Husband reports he feels patient is doing better since stopping all meds except for ASA, Lisinopril, Levothyroxine, Zoloft and Melatonin.  Husband concerned that patient is having problems with constipation.  Husband reports he administers Senna S 1 tab at  night 1-2 times per week.  Husband also states caregiver will give patient a suppository as needed in the AM.  Education to husband to administer 1-2 tabs of Senna every night for constipation.  Patient remains dependant for all ADL's.  Patient unable to stand to be weighed.  RN measures patient's left arm circumference which measures 25 cm 12 cm up.  Patient sleeping well at night after taking Melatonin.  Patient and husband screening for 2019-nCoV negative when RN called prior to visit.  Husband informed to call [facility] with questions or concerns.                 Nilda Simmer, RN

## 2018-10-08 NOTE — Progress Notes (Signed)
COMMUNITY PALLIATIVE CARE SW NOTE  PATIENT NAME: Christina [name] DOB: Jun 02, 1951 MRN: [id number]  PRIMARY CARE PROVIDER: Virginia Crews, MD  RESPONSIBLE PARTY:  Acct ID - Guarantor Home Phone Work Phone Relationship Acct Type  [id number] - [name],[name]  Self P/F     [place], [place], [place] [address]     PLAN OF CARE and INTERVENTIONS:             1. GOALS OF CARE/ ADVANCE CARE PLANNING:  Patient is a DNR. Husband wants to focus on comfort care at this time.  2. SOCIAL/EMOTIONAL/SPIRITUAL ASSESSMENT/ INTERVENTIONS:  Patient continues to be at home and is cared for by her husband Christina [name]. He has hired Programme researcher, broadcasting/film/video to assist with care in the mornings, evenings, and if he needs/wants to be able to get out of the house. Patient continues to show signs of decline, however is not yet hospice appropriate. Patient makes eye contact, smiles, and can answer simple questions about how she is doing or can recall her daughter's name. Patient and husband both continue to have good support from family, friends in the community and church family.  3. PATIENT/CAREGIVER EDUCATION/ COPING:  Husband is coping and adapting well to patient's increasing needs. He maintains a positive attitude and is also aware of the need for his own self-care. Ongonig support to husband to schedule times to be away from home in order to avoid caregiver fatigue. Support continues.  4. PERSONAL EMERGENCY PLAN:  Plan will be to call 911 for emergencies.  5. COMMUNITY RESOURCES COORDINATION/ HEALTH CARE NAVIGATION:  No needs at this time.  6. FINANCIAL/LEGAL CONCERNS/INTERVENTIONS:  None     SOCIAL HX:  Social History   Tobacco Use  . Smoking status: Never Smoker  . Smokeless tobacco: Never Used  Substance Use Topics  . Alcohol use: Not Currently    Alcohol/week: 0.0 standard drinks    CODE STATUS:   Code Status: Not on file  ADVANCED DIRECTIVES: N MOST FORM COMPLETE:  yes HOSPICE EDUCATION PROVIDED:  None this visit  IDU:[id number] is dependent for all ADLs.   I spent 80 minutes rom 12:00p-1:20p providing support to patient and husband.       [name]

## 2018-10-12 ENCOUNTER — Ambulatory Visit: Payer: Medicare Other | Admitting: Family Medicine

## 2018-10-12 ENCOUNTER — Encounter: Payer: Self-pay | Admitting: Family Medicine

## 2018-10-12 ENCOUNTER — Ambulatory Visit (INDEPENDENT_AMBULATORY_CARE_PROVIDER_SITE_OTHER): Payer: PPO | Admitting: Family Medicine

## 2018-10-12 DIAGNOSIS — E7800 Pure hypercholesterolemia, unspecified: Secondary | ICD-10-CM

## 2018-10-12 DIAGNOSIS — K5900 Constipation, unspecified: Secondary | ICD-10-CM

## 2018-10-12 DIAGNOSIS — G3109 Other frontotemporal dementia: Secondary | ICD-10-CM | POA: Diagnosis not present

## 2018-10-12 DIAGNOSIS — F331 Major depressive disorder, recurrent, moderate: Secondary | ICD-10-CM | POA: Diagnosis not present

## 2018-10-12 DIAGNOSIS — R7303 Prediabetes: Secondary | ICD-10-CM

## 2018-10-12 DIAGNOSIS — E039 Hypothyroidism, unspecified: Secondary | ICD-10-CM

## 2018-10-12 DIAGNOSIS — F0280 Dementia in other diseases classified elsewhere without behavioral disturbance: Secondary | ICD-10-CM | POA: Diagnosis not present

## 2018-10-12 DIAGNOSIS — I10 Essential (primary) hypertension: Secondary | ICD-10-CM | POA: Diagnosis not present

## 2018-10-12 NOTE — Assessment & Plan Note (Signed)
Chronic and previously well controlled Husband has not noticed any symptoms Continue Synthroid 50 mcg daily Recheck TSH-we will see if hospice nurse can obtain labs at her next visit Follow-up in 6 months

## 2018-10-12 NOTE — Progress Notes (Signed)
Erroneous encounter

## 2018-10-12 NOTE — Assessment & Plan Note (Signed)
Unlikely to benefit from statin therapy given advanced dementia Stopped statin

## 2018-10-12 NOTE — Progress Notes (Signed)
Patient: Christina [name] Female    DOB: 11/25/1950   68 y.o.   MRN: [id number] Visit Date: 10/12/2018  Today's Provider: Lavon Paganini, MD   Chief Complaint  Patient presents with  . Hypertension  . Hypothyroidism  . Dementia   Subjective:    I, Porsha McClurkin CMA, am acting as a scribe for Lavon Paganini, MD.   Virtual Visit via Video Note  I connected with Christina [name] on 10/12/18 at  2:00 PM EDT by a video enabled telemedicine application and verified that I am speaking with the correct person using two identifiers.   I discussed the limitations of evaluation and management by telemedicine and the availability of in person appointments. The patient expressed understanding and agreed to proceed.   Patient location: home Provider location: [facility]  Majority of history is obtained from patient's husband given her severe dementia.   HPI  Hypertension, follow-up:  BP Readings from Last 3 Encounters:  10/06/18 124/64  09/13/18 124/70  08/09/18 124/78    She was last seen for hypertension 6 months ago.  BP at that visit was 124/62. Management changes since that visit include: continue Lisinopril 10 mg daily. She reports excellent compliance with treatment. She is not having side effects. She is not exercising. She is adherent to low salt diet.   Outside blood pressures are 124/64 at visit from [place] last week. She is experiencing none.  Patient denies chest pain, dyspnea, irregular heart beat, near-syncope and orthopnea.   Cardiovascular risk factors include advanced age (older than 5 for men, 60 for women), dyslipidemia, hypertension and sedentary lifestyle.  Use of agents associated with hypertension: none.     Weight trend: stable Wt Readings from Last 3 Encounters:  03/08/17 126 lb (57.2 kg)  09/07/16 122 lb (55.3 kg)  02/25/16 124 lb (56.2 kg)    Current diet: not asked   ------------------------------------------------------------------------  Hypothyroidism Patient presents today for 6 month follow-up. Patient is currently taking Synthroid 50 mcg. Unknown if she is having any symptoms.   Dementia Patient presents for 6 month follow-up. Patient is nonambulatory and reliant on her husband and caretaker for all ADLs. She is no longer taking Namenda or Aricept.  Patient was also switched from Paxil to Zoloft within the last 6 months.  She is taking this for depression, but I advised against Paxil given its anticholinergic effects in the setting of her dementia.  Dementia and behavior are better since stopping Paxil  Patient is no longer taking Zocor. She is followed by Hospice. They come 1 time per month.    Patient has been struggling with constipation.  She was advised on clean-out with Milk of Mag and then stool softeners to maintain.  She did have some BM with Milk of Mag but it was hard.  They are using glycerin suppositories sometimes.  Difficult to get her to drink water throughout the daily.  Patient has had recent UTIs. Last 2 cultures have shown pan-sensitive E coli and symptoms have resolved with Keflex treatment.  Husband knows to provide urine sample if this occurs again.    Allergies  Allergen Reactions  . Sulfa Antibiotics Hives     Current Outpatient Medications:  .  acetaminophen (TYLENOL) 500 MG tablet, Take 500 mg by mouth every 6 (six) hours as needed., Disp: , Rfl:  .  AMBULATORY NON FORMULARY MEDICATION, Hoyer Lift DX: G31.83, Disp: 1 Device, Rfl: 0 .  aspirin 81 MG tablet,  Take 81 mg by mouth daily., Disp: , Rfl:  .  calcium citrate-vitamin D (CITRACAL+D) 315-200 MG-UNIT tablet, Take 1 tablet by mouth 2 (two) times daily., Disp: , Rfl:  .  cholecalciferol (VITAMIN D) 1000 units tablet, Take 1,000 Units by mouth daily., Disp: , Rfl:  .  fish oil-omega-3 fatty acids 1000 MG capsule, Take 2 g by mouth daily., Disp: , Rfl:  .   levothyroxine (SYNTHROID, LEVOTHROID) 50 MCG tablet, Take 50 mcg by mouth daily., Disp: , Rfl:  .  lisinopril (PRINIVIL,ZESTRIL) 10 MG tablet, Take 1 tablet (10 mg total) by mouth daily., Disp: 90 tablet, Rfl: 3 .  Melatonin 10 MG TABS, Take by mouth., Disp: , Rfl:  .  Multiple Vitamin (MULTIVITAMIN) tablet, Take 1 tablet by mouth daily., Disp: , Rfl:  .  sertraline (ZOLOFT) 100 MG tablet, Take 1 tablet (100 mg total) by mouth daily., Disp: 90 tablet, Rfl: 3 .  vitamin E 1000 UNIT capsule, Take 1,000 Units by mouth daily., Disp: , Rfl:   Review of Systems  Constitutional: Negative for activity change and appetite change.  Respiratory: Negative.   Cardiovascular: Negative.   Gastrointestinal: Negative.   Genitourinary: Negative.     Social History   Tobacco Use  . Smoking status: Never Smoker  . Smokeless tobacco: Never Used  Substance Use Topics  . Alcohol use: Not Currently    Alcohol/week: 0.0 standard drinks      Objective:   There were no vitals taken for this visit. There were no vitals filed for this visit. Did review vitals from visit last week with hospice RN   Physical Exam Constitutional:      General: She is not in acute distress.    Appearance: She is not diaphoretic.  HENT:     Head: Normocephalic and atraumatic.  Eyes:     General: No scleral icterus.    Conjunctiva/sclera: Conjunctivae normal.  Pulmonary:     Effort: Pulmonary effort is normal. No respiratory distress.  Neurological:     Mental Status: She is alert. Mental status is at baseline.     Comments: Minimally verbal  Psychiatric:        Mood and Affect: Mood normal.         Assessment & Plan   Problem List Items Addressed This Visit      Cardiovascular and Mediastinum   Essential (primary) hypertension    Well-controlled on blood pressure from hospice nurse visit last week Continue lisinopril 10 mg daily Recheck metabolic panel -we will see if hospice nurse is able to get these at  her next visit Discussed that as dementia worsens if her intake decreases, she may need less medication, so we will monitor for any hypotension Follow-up in 6 months      Relevant Orders   Comprehensive metabolic panel     Endocrine   Hypothyroidism - Primary    Chronic and previously well controlled Husband has not noticed any symptoms Continue Synthroid 50 mcg daily Recheck TSH-we will see if hospice nurse can obtain labs at her next visit Follow-up in 6 months      Relevant Orders   TSH     Nervous and Auditory   Dementia ([place])    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 Has progressed fairly quickly She is now mostly nonverbal, nonambulatory, and reliant on her husband and caretaker for all ADLs We have discussed in the past that she is unlikely to benefit from many health maintenance interventions and  she is now followed by hospice She is doing better since we stopped Paxil She does not require statin at this time She is also stopped Namenda and Aricept        Other   Constipation    Discussed that this is a chronic issue in many elderly patients  Can continue suppositories as needed Recommended daily MiraLAX Increase fluid intake as possible Discussed return precautions      Prediabetes    On no medications Given her advanced dementia, it is unlikely this will need to be treated unless she develops wildly uncontrolled diabetes Recheck A1c with labs with hospice nurse is able to obtain these at next visit      Relevant Orders   Hemoglobin A1c   Hypercholesterolemia    Unlikely to benefit from statin therapy given advanced dementia Stopped statin      Relevant Orders   Comprehensive metabolic panel   MDD (major depressive disorder)    Chronic and stable Doing well after switching from Paxil to Zoloft          Return in about 6 months (around 04/14/2019) for chronic disease f/u.   The entirety of the information documented in the History of  Present Illness, Review of Systems and Physical Exam were personally obtained by me. Portions of this information were initially documented by Hurman Horn, CMA and reviewed by me for thoroughness and accuracy.   Follow Up Instructions:  I discussed the assessment and treatment plan with the patient. The patient was provided an opportunity to ask questions and all were answered. The patient agreed with the plan and demonstrated an understanding of the instructions.   The patient was advised to call back or seek an in-person evaluation if the symptoms worsen or if the condition fails to improve as anticipated.  I provided 30 minutes of non-face-to-face time during this encounter.   Virginia Crews, MD, MPH [facility] 10/12/2018 3:32 PM

## 2018-10-12 NOTE — Assessment & Plan Note (Signed)
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

## 2018-10-12 NOTE — Assessment & Plan Note (Signed)
On no medications Given her advanced dementia, it is unlikely this will need to be treated unless she develops wildly uncontrolled diabetes Recheck A1c with labs with hospice nurse is able to obtain these at next visit

## 2018-10-12 NOTE — Assessment & Plan Note (Signed)
Chronic and stable Doing well after switching from Paxil to Zoloft

## 2018-10-12 NOTE — Assessment & Plan Note (Signed)
Well-controlled on blood pressure from hospice nurse visit last week Continue lisinopril 10 mg daily Recheck metabolic panel -we will see if hospice nurse is able to get these at her next visit Discussed that as dementia worsens if her intake decreases, she may need less medication, so we will monitor for any hypotension Follow-up in 6 months

## 2018-10-12 NOTE — Patient Instructions (Addendum)
We will try to get labs next time the nurses are out at your house.  Continue current medications.  Try daily Miralax to manage constipation

## 2018-10-12 NOTE — Assessment & Plan Note (Addendum)
Discussed that this is a chronic issue in many elderly patients  Can continue suppositories as needed Recommended daily MiraLAX Increase fluid intake as possible Discussed return precautions

## 2018-10-24 ENCOUNTER — Other Ambulatory Visit: Payer: Self-pay

## 2018-10-24 ENCOUNTER — Other Ambulatory Visit: Payer: PPO

## 2018-10-24 DIAGNOSIS — Z515 Encounter for palliative care: Secondary | ICD-10-CM

## 2018-10-24 DIAGNOSIS — F0280 Dementia in other diseases classified elsewhere without behavioral disturbance: Secondary | ICD-10-CM | POA: Diagnosis not present

## 2018-10-24 DIAGNOSIS — E039 Hypothyroidism, unspecified: Secondary | ICD-10-CM | POA: Diagnosis not present

## 2018-10-24 DIAGNOSIS — E7800 Pure hypercholesterolemia, unspecified: Secondary | ICD-10-CM | POA: Diagnosis not present

## 2018-10-24 DIAGNOSIS — I779 Disorder of arteries and arterioles, unspecified: Secondary | ICD-10-CM | POA: Diagnosis not present

## 2018-10-24 NOTE — Progress Notes (Signed)
PATIENT NAME: Christina [name] DOB: 1951-01-08 MRN: [id number]  PRIMARY CARE PROVIDER: Virginia Crews, MD  RESPONSIBLE PARTY:  Acct ID - Guarantor Home Phone Work Phone Relationship Acct Type  [id number] - [name],[phone]  Self P/F     [place], [place] [address]    PLAN OF CARE and INTERVENTIONS:               1.  GOALS OF CARE/ ADVANCE CARE PLANNING: Remain at home with husband as long as he can manage patient's care.               2.  PATIENT/CAREGIVER EDUCATION:  Education on safety precautions to prevent infection of CoV19, Education on measures to prevent constipation.                3.  DISEASE STATUS: Patient is a 68 year old patient with Dementia who lives with and is cared for by her husband Christina [name].     HISTORY OF PRESENT ILLNESS:    CODE STATUS: DNR  ADVANCED DIRECTIVES: Y MOST FORM: No PPS: 30%   PHYSICAL EXAM:   VITALS: TELEMED visit, no vitals taken.  LUNGS: Husband reports patient has not had any shortness of breath, patient has a non productive cough husband feels if allergy related, no fever.  CARDIAC: No cardiac issues at the present time. EXTREMITIES: Trace edema, patient has LE's elevated. SKIN: skin pale with lesions or bruising.  NEURO: Alert to self, smiles during visit and says "hello"  Due to the COVID-19 crisis, this visit was done via telemedicine from my office and it was initiated and consent by this patient and or family. Husband Christina [name] gave consent and knowledgeable in connecting to Zoom for TELEMED visit.  TELEMED visit with SW Christina [name],  Patient and husband Christina [name].  Patient sitting in recliner chair with feet elevated.  Patient smiling and says "hello" to RN and SW when patient sees Palliative Care team on screen.  Husband reports he feels patient may "be doing a little better."  Husband states patient has ben eating better and has been able to say "a few more words" especially to hired caregiver Christina [name].  Husband reports hired  caregiver is god to patient and sings to patient and took patient outside last week when the weather was warm.  Husband reports it continues to take 1 - 1 1/2 hours to feed patient meals.  Husband states he may decrease patient's meals to 2 times per day.  RN provided education to give patient a nutritional supplement between meals if patient's intake is not adequate.  Patient saw primary care provider via [facility] and husband reports MD would like TSH lab drawn on patient.  RN informs husband Palliative Care team does not routinely draw labs.  Husband in agreement with RN following up with MD when CoV19 crisis is over.  Patient remains on thyroid medication at the present time.  Husband reports he feels patient is "napping more" during the day.  Husband states patient has been taking Miralax 17 grams (1capful) daily for constipation.  Husband reports patient's bowel movement was loose this AM.  Husband informed to try decreasing to 3/4 capful daily if stools remain loose.  Husband reports he feels overall patient "is doing okay" at the present time.  Husband remains concerned about the CoV19 virus and going to get needed supplies.  Husband reports he is planning to go to [employer] in the AM at 6:00 AM when they open  for seniors.  Husband denies having any concerns regarding patient at the present time.  Husband encouraged to contact Palliative Care with questions or concerns.                       Nilda Simmer, RN

## 2018-10-24 NOTE — Progress Notes (Signed)
COMMUNITY PALLIATIVE CARE SW NOTE  PATIENT NAME: Christina [name] DOB: 1950-09-11 MRN: [id number]  PRIMARY CARE PROVIDER: Virginia Crews, MD  RESPONSIBLE PARTY:  Acct ID - Guarantor Home Phone Work Phone Relationship Acct Type  [id number] - Foresta,[name]  Self P/F     [place], [place], [place] [address]     PLAN OF CARE and INTERVENTIONS:             1. GOALS OF CARE/ ADVANCE CARE PLANNING:  Patient is a DNR. Goal is comfort care. 2. SOCIAL/EMOTIONAL/SPIRITUAL ASSESSMENT/ INTERVENTIONS:  SW and RN completed TELEHEALTH visit with patient and husband, Gene. Patient was alert, able to watch the screen and smile. Patient is doing "pretty good" per Gene. Gene reports that patient is eating better, but still takes time to feed. Patient continues to nap during the day. Gene reports that patient is answering with one or two words. Paid caregiver has been singing to patient, and patient enjoys that. Patient was able to go outside with the warmer weather.  3. PATIENT/CAREGIVER EDUCATION/ COPING:  Gene indicates positive coping skills, strong family support and the understanding of self-care. Gene expressed concerns for COVID-19 and exposure. Gene said that he needs the paid caregiver's help with care, but the visits make him nervous with exposing patient. Gene reports that he has cloth masks and is planning to use those when he goes out and RN suggested that paid caregiver also wear one during her visits. 4. PERSONAL EMERGENCY PLAN:  Patient/family would call 9-1-1 for emergencies. Due to COVID-19 crisis, Gene verbalized they are remaining at home, as able, and washing hands frequently.  5. COMMUNITY RESOURCES COORDINATION/ HEALTH CARE NAVIGATION:  Patient had a virtual visit with PCP on 3/25. Gene said that they wanted to recheck TSH, RN aware and will follow-up. Denies any other concerns. 6. FINANCIAL/LEGAL CONCERNS/INTERVENTIONS:  None at this time.     SOCIAL HX:  Social History    Tobacco Use  . Smoking status: Never Smoker  . Smokeless tobacco: Never Used  Substance Use Topics  . Alcohol use: Not Currently    Alcohol/week: 0.0 standard drinks    CODE STATUS:   Code Status: Not on file  ADVANCED DIRECTIVES: N MOST FORM COMPLETE:  Yes HOSPICE EDUCATION PROVIDED: None.  PPS: Patient is dependent for al ADLs.  Due to the COVID-19 crisis, this visit was done via telemedicine from my office and it was initiated and consent by this patient and/or family.  Patient gave consent and competent to perform Zoom for TELEHEALTH.   I spent 45 minutes with patient/family, from 10:30-11:15am providing education, support and consultation.    Margaretmary Lombard, LCSW

## 2018-11-06 DIAGNOSIS — E039 Hypothyroidism, unspecified: Secondary | ICD-10-CM | POA: Diagnosis not present

## 2018-11-06 DIAGNOSIS — F0280 Dementia in other diseases classified elsewhere without behavioral disturbance: Secondary | ICD-10-CM | POA: Diagnosis not present

## 2018-11-06 DIAGNOSIS — G3183 Dementia with Lewy bodies: Secondary | ICD-10-CM | POA: Diagnosis not present

## 2018-11-06 DIAGNOSIS — E7800 Pure hypercholesterolemia, unspecified: Secondary | ICD-10-CM | POA: Diagnosis not present

## 2018-11-17 ENCOUNTER — Telehealth: Payer: Self-pay

## 2018-11-17 NOTE — Telephone Encounter (Signed)
Telephone call to patient's husband to schedule TELEHEALTH visit for patient.  Husband Gene in agreement with TELEHEALTH visit. On Tuesday 11-22-18 at 2:00 PM.

## 2018-11-21 ENCOUNTER — Telehealth: Payer: Self-pay

## 2018-11-21 NOTE — Telephone Encounter (Signed)
Telephone call to patient to reschedule TELEHEALTH visit with patient/family. SW spoke with Gene, patient's husband. Gene in agreement with TELEHEALTH visit to be moved to 11-23-18 at 2:00 PM.

## 2018-11-22 ENCOUNTER — Other Ambulatory Visit: Payer: Self-pay

## 2018-11-22 ENCOUNTER — Other Ambulatory Visit: Payer: PPO

## 2018-11-23 ENCOUNTER — Other Ambulatory Visit: Payer: PPO

## 2018-11-23 ENCOUNTER — Other Ambulatory Visit: Payer: Self-pay

## 2018-11-23 DIAGNOSIS — Z515 Encounter for palliative care: Secondary | ICD-10-CM

## 2018-11-23 DIAGNOSIS — I779 Disorder of arteries and arterioles, unspecified: Secondary | ICD-10-CM | POA: Diagnosis not present

## 2018-11-23 DIAGNOSIS — E7800 Pure hypercholesterolemia, unspecified: Secondary | ICD-10-CM | POA: Diagnosis not present

## 2018-11-23 DIAGNOSIS — E039 Hypothyroidism, unspecified: Secondary | ICD-10-CM | POA: Diagnosis not present

## 2018-11-23 DIAGNOSIS — F0280 Dementia in other diseases classified elsewhere without behavioral disturbance: Secondary | ICD-10-CM | POA: Diagnosis not present

## 2018-11-23 NOTE — Progress Notes (Signed)
COMMUNITY PALLIATIVE CARE SW NOTE  PATIENT NAME: Christina [name] DOB: 05/07/1951 MRN: [id number]  PRIMARY CARE PROVIDER: Virginia Crews, MD  RESPONSIBLE PARTY:  Acct ID - Guarantor Home Phone Work Phone Relationship Acct Type  [id number] - Ritsema,[phone]  Self P/F     [place], [place],  [address]     PLAN OF CARE and INTERVENTIONS:             1. GOALS OF CARE/ ADVANCE CARE PLANNING:  Patient is a DNR. Goal is comfort care. 2. SOCIAL/EMOTIONAL/SPIRITUAL ASSESSMENT/ INTERVENTIONS:   SW and RN completed TELEHEALTH visit with patient and husband, Gene. Gene reports that patient is doing "okay". Gene provided brief medical history. Gene noticed that patient is sleeping more during the day. Patient is eating about the same, if she does not eat then Gene will give her Ensure. Patient has had issues with constipation, Gene adjusting her laxatives. Paid caregiver continues to visit and is wearing a mask. SW and RN talked about patient and Gene's love for [employer] and [place]. Patient enjoys music, team encouraged continued engagement with patient. 3. PATIENT/CAREGIVER EDUCATION/ COPING:  Patient and Gene seem to be coping well. Gene reports patient has "good attitude", other than a few evenings where she cries for a brief period. 4. PERSONAL EMERGENCY PLAN:  Patient/family would call 9-1-1 for emergencies. Due to COVID-19 crisis, Gene verbalized they are remaining at home, as able, and washing hands frequently. 5. COMMUNITY RESOURCES COORDINATION/ HEALTH CARE NAVIGATION:  Gene said they had a telephonic visit with PCP last week. No issues. 6. FINANCIAL/LEGAL CONCERNS/INTERVENTIONS:  None.     SOCIAL HX:  Social History   Tobacco Use  . Smoking status: Never Smoker  . Smokeless tobacco: Never Used  Substance Use Topics  . Alcohol use: Not Currently    Alcohol/week: 0.0 standard drinks    CODE STATUS:   Code Status: Not on file  ADVANCED DIRECTIVES: N MOST FORM COMPLETE:   Yes HOSPICE EDUCATION PROVIDED: None.  PPS: Patient is dependent of ADLs.  Due to the COVID-19 crisis, this visit was done via Zoom from my office and it was initiated and consent by this patient and/or family.This was a scheduled visit.  I spent 45 minutes with patient/family, from 2:00-2:45pm providing education, support and consultation.   Margaretmary Lombard, LCSW

## 2018-11-23 NOTE — Progress Notes (Signed)
PATIENT NAME: Christina [name] DOB: 10-24-1950 MRN: [id number]  PRIMARY CARE PROVIDER: Virginia Crews, MD  RESPONSIBLE PARTY:  Acct ID - Guarantor Home Phone Work Phone Relationship Acct Type  [id number] - Christina [name],Christina [name]  Self P/F     [place], [place] [address]    PLAN OF CARE and INTERVENTIONS:               1.  GOALS OF CARE/ ADVANCE CARE PLANNING:  Remain at home with husband for as long as husband can manage patient's care.               2.  PATIENT/CAREGIVER EDUCATION:  Educatin on measures to treat constipation, education on s/s of infection, cough, fever chills, shortness of breath, fatigue.               3.  DISEASE STATUS: Patient is a 50 yea old patient with dementia.  Patient lives at home with her husband Christina [name].  Christina [name] has hired caregiver that comes a few times throughout the day to assist with patient's care.  SW and RN completed visit via Zoom. Due to the COVID-19 crisis, this visit was done via telemedicine from my office and it was initiated and consent by this patient and or family.  Patient sitting in chair, husband Christina [name] reports he just finished feeding patient her lunch.  Husband states patient eats very slowly and it takes 1 1 1/2 hours to feed patient meals.  Husband states patient has been sleeping more and a few days in the past week patient did not eat lunch.  Patient smiling and shakes her head yes at times when spoken to.  Husband reports patient interacts very little with him.  Patient smiles frequently and appears comfortable.  Husband reports patient continues to have issues with constipation.  Husband has been administering Miralax daily to patient.  Husband reports patient received a dose of MOM on Monday PM due to constipation.  Patient is not having any behavior issues.  Patient respiration even and unlabored.  Patient has been sleeping well at night.  Husband reports he and patient are staying in close and no one has been in home other than  caregiver.  Support provided to husband.  Husband denies having any concerns at the present time.  Husband informed to call with questions or concerns.                 HISTORY OF PRESENT ILLNESS:    CODE STATUS: Christina [name]  ADVANCED DIRECTIVES: Y MOST FORM: No PPS: 30%   PHYSICAL EXAM:   VITALS: No vital signs taken/ TELEHEALTH visit  LUNGS: no cough or shortness of breath CARDIAC:  EXTREMITIES: none edema SKIN: Skin color, texture, turgor normal. No rashes or lesions  NEURO: positive for gait problems, memory problems and speech problems       Christina Simmer, RN

## 2018-12-06 DIAGNOSIS — E039 Hypothyroidism, unspecified: Secondary | ICD-10-CM | POA: Diagnosis not present

## 2018-12-06 DIAGNOSIS — E7800 Pure hypercholesterolemia, unspecified: Secondary | ICD-10-CM | POA: Diagnosis not present

## 2018-12-06 DIAGNOSIS — G3183 Dementia with Lewy bodies: Secondary | ICD-10-CM | POA: Diagnosis not present

## 2018-12-06 DIAGNOSIS — F0280 Dementia in other diseases classified elsewhere without behavioral disturbance: Secondary | ICD-10-CM | POA: Diagnosis not present

## 2018-12-19 ENCOUNTER — Telehealth: Payer: Self-pay

## 2018-12-19 NOTE — Telephone Encounter (Signed)
Telephone call to patient to schedule TELEHEALTH visit with patient/family. Patient/family in agreement with TELEHEALTH visit on 12-26-18 at 2:00 PM.

## 2018-12-22 ENCOUNTER — Other Ambulatory Visit: Payer: Self-pay

## 2018-12-22 MED ORDER — LEVOTHYROXINE SODIUM 50 MCG PO TABS: 50.0000 ug | ORAL_TABLET | Freq: Every day | ORAL | 1 refills | Status: DC

## 2018-12-24 DIAGNOSIS — E7800 Pure hypercholesterolemia, unspecified: Secondary | ICD-10-CM | POA: Diagnosis not present

## 2018-12-24 DIAGNOSIS — I779 Disorder of arteries and arterioles, unspecified: Secondary | ICD-10-CM | POA: Diagnosis not present

## 2018-12-24 DIAGNOSIS — F0280 Dementia in other diseases classified elsewhere without behavioral disturbance: Secondary | ICD-10-CM | POA: Diagnosis not present

## 2018-12-24 DIAGNOSIS — E039 Hypothyroidism, unspecified: Secondary | ICD-10-CM | POA: Diagnosis not present

## 2018-12-26 ENCOUNTER — Other Ambulatory Visit: Payer: Self-pay

## 2018-12-26 ENCOUNTER — Other Ambulatory Visit: Payer: PPO

## 2018-12-26 DIAGNOSIS — Z515 Encounter for palliative care: Secondary | ICD-10-CM

## 2018-12-26 NOTE — Progress Notes (Signed)
COMMUNITY PALLIATIVE CARE SW NOTE  PATIENT NAME: AUTUM [name] DOB: 12/13/1950 MRN: [id number]  PRIMARY CARE PROVIDER: Virginia Crews, MD  RESPONSIBLE PARTY:  Acct ID - Guarantor Home Phone Work Phone Relationship Acct Type  [id number] - Dosh,[name]  Self P/F     [place], [place], [place] [address]     PLAN OF CARE and INTERVENTIONS:             1. GOALS OF CARE/ ADVANCE CARE PLANNING: Patient is a DNR, form is in the home. Goal is comfort care. 2. SOCIAL/EMOTIONAL/SPIRITUAL ASSESSMENT/ INTERVENTIONS:  SW and RN completed TELEHEALTH visit with patient and husband, Christina [name]. Christina [name] said that patient just woke up prior to call. [name] sleep schedule is off and he is unsure if she is actually getting rest at night. Christina [name] thinks that patient may be sleeping overall. Family and caregiver are having increased difficulty feeding patient. Christina [name] said that it can take an hour to an hour and a half to feed her. Christina [name] said patient has not wanted to eat as much this week so he has given her supplements. Patient is mostly nonverbal, but does smile. Patient will sometimes sing/hum to music, especially with caregiver. Patient has a squeeze ball that Christina [name] said she will squeeze some.  3. PATIENT/CAREGIVER EDUCATION/ COPING:  Patient and Christina [name] are coping well. Patient has supportive family and caregiver. Christina [name] said patient's children came to visit on Memorial Day weekend. Christina [name] tries to take breaks and walk the dog, spend time on the computer, and talk with friends. 4. PERSONAL EMERGENCY PLAN:  Family would call 9-1-1 for emergencies.Due to COVID-19 crisis,Geneverbalized they areremaining at home, as able,and washing hands frequently. 5. COMMUNITY RESOURCES COORDINATION/ HEALTH CARE NAVIGATION:  None. Private caregiver continues to visit. 6. FINANCIAL/LEGAL CONCERNS/INTERVENTIONS:  None.    SOCIAL HX:  Social History   Tobacco Use  . Smoking status: Never Smoker  . Smokeless tobacco: Never Used   Substance Use Topics  . Alcohol use: Not Currently    Alcohol/week: 0.0 standard drinks    CODE STATUS:   Code Status: Not on file  ADVANCED DIRECTIVES: N MOST FORM COMPLETE: Yes HOSPICE EDUCATION PROVIDED: None.  PPS: Patient is dependent of ADLs.  Due to the COVID-19 crisis, this visit was done via Zoom from my office and it was initiated and consent by this patient and/or family.This was a scheduled visit.  I spent 45 minutes with patient/family, from2:00-2:45pmproviding education, support and consultation.    Margaretmary Lombard, LCSW

## 2018-12-26 NOTE — Progress Notes (Signed)
PATIENT NAME: Christina [name] DOB: 1951-06-11 MRN: [id number]  PRIMARY CARE PROVIDER: Virginia Crews, MD  RESPONSIBLE PARTY:  Acct ID - Guarantor Home Phone Work Phone Relationship Acct Type  [id number] - Crumbley,[name]  Self P/F     Christina [name] [address]    PLAN OF CARE and INTERVENTIONS:               1.  GOALS OF CARE/ ADVANCE CARE PLANNING:  Remain in home with husband for as long as patient's care be managed.                 2.  PATIENT/CAREGIVER EDUCATION:  Education on fall precautions, education on nutritional supplements to increase caloric intake, support to husband in managing patient's care.               3.  DISEASE STATUS: Patient is a 68 year old patient with dementia.  SW and RN met with patient and husband. Due to the COVID-19 crisis, this visit was done via telemedicine from my office and it was initiated and consent by this patient and or family. Patient sitting in chair in living room.  [name] husband Christina [name] reports patient is sleeping more.  Husband reports patient slept prior to telehealth visit.  Husband states patient is not wanting to eat at times.  Husband states he gives patient a nutritional supplement to drink when patient refuses to eat. Husband reports it continues to take 1 1/2 hours to fed patient.  Husband reports patient is coughing with eating at times.  Patient also keeping hands drawn and husband states patient appears stiffer.  Husband concerned patient may have pain.  RN informed husband to administer Arthritis Tylenol to patient bid prn.  Patient has no open areas of skin breakdown.  Education provided to husband on use of Dermacloud ointment to prevent skin breakdown. Patient will sometimes repeat a few words that are spoken to patient.  Patient smiles during visit but is non verbal during visit.  Caregiver brought squeeze ball to home and patient will squeeze ball at times.  Patient continues to have issues with constipation and  husband administers Mild of Magnesia or clear lax to patient.  Husband reports patient's daughter visited over [place] Day weekend.  Patient had telehealth visit with MD last month.  Support provided to husband in managing patient's care.  Caregiver arrives during visit and denies having any concerns regarding patient.  Husband and caregiver encoaurged to call with questions or concerns.                HISTORY OF PRESENT ILLNESS:    CODE STATUS: DNR  ADVANCED DIRECTIVES: y MOST FORM: No PPS: 30%   PHYSICAL EXAM:   VITALS: No vital signs taken telehealth visit  LUNGS: no shortness of breath/ patient coughs with eating at times CARDIAC:  EXTREMITIES: no edema in lower extremities SKIN: no open areas of skin breakdown  NEURO: positive for gait problems, memory problems and speech problems       Christina Simmer, RN

## 2019-01-03 ENCOUNTER — Telehealth: Payer: Self-pay | Admitting: Family Medicine

## 2019-01-03 NOTE — Telephone Encounter (Signed)
Pt's husband came by on Monday, 01-02-2019 and dropped off a DNR form to be signed.   Pt's husband wanted a call back once it's been signed. He wants to post this the home.  Thanks, [employer]

## 2019-01-03 NOTE — Telephone Encounter (Signed)
Have you seen this? I have not. Thanks!

## 2019-01-03 NOTE — Telephone Encounter (Signed)
The DNR order has been placed in Dr. B.'s box if it can be signed by another physician since Dr. Jacinto Reap will not be back in office until June 29th.  Thanks, [employer]

## 2019-01-04 NOTE — Telephone Encounter (Signed)
I have not seen this but am happy to sign if needed.

## 2019-01-06 DIAGNOSIS — G3183 Dementia with Lewy bodies: Secondary | ICD-10-CM | POA: Diagnosis not present

## 2019-01-06 DIAGNOSIS — F0280 Dementia in other diseases classified elsewhere without behavioral disturbance: Secondary | ICD-10-CM | POA: Diagnosis not present

## 2019-01-06 DIAGNOSIS — E039 Hypothyroidism, unspecified: Secondary | ICD-10-CM | POA: Diagnosis not present

## 2019-01-06 DIAGNOSIS — E7800 Pure hypercholesterolemia, unspecified: Secondary | ICD-10-CM | POA: Diagnosis not present

## 2019-01-16 ENCOUNTER — Telehealth: Payer: Self-pay

## 2019-01-16 NOTE — Telephone Encounter (Signed)
Telephone call to patient's home to schedule palliative care visit.  Husband Gene requests home visit for patient on 01/19/19 at 2:30 PM.

## 2019-01-19 ENCOUNTER — Other Ambulatory Visit: Payer: PPO

## 2019-01-19 ENCOUNTER — Other Ambulatory Visit: Payer: Self-pay

## 2019-01-19 DIAGNOSIS — Z515 Encounter for palliative care: Secondary | ICD-10-CM

## 2019-01-19 NOTE — Progress Notes (Signed)
COMMUNITY PALLIATIVE CARE SW NOTE  PATIENT NAME: Christina [name] DOB: 10/05/1950 MRN: [id number]  PRIMARY CARE PROVIDER: Virginia Crews, MD  RESPONSIBLE PARTY:  Acct ID - Guarantor Home Phone Work Phone Relationship Acct Type  [id number] - Winship,Christina [name]  Self P/F     [place], [place], [place] [address]     PLAN OF CARE and INTERVENTIONS:             1. GOALS OF CARE/ ADVANCE CARE PLANNING:  Patient is a DNR, form is in the home. Goal is comfort care. 2. SOCIAL/EMOTIONAL/SPIRITUAL ASSESSMENT/ INTERVENTIONS:  SW and RN met with patient and husband, Christina [name] in their home. Paid caregiver was also present. Patient was smiling at staff, attentive to staff and would respond with "yes" or "no". No pain concerns at this time. Patient enjoys watching television and having visits with her children and grandchildren. Team and Christina [name] reflected on patient's life and the activities that they did together. Christina [name] was tearful recalling the last time that they danced together. SW validated the memories that they share and provided emotional support. Team encouraged continued use of music and dance. Patient does "fidget" so SW discussed a "busy blanket" but Christina [name] is unsure if patient would engage with it.  3. PATIENT/CAREGIVER EDUCATION/ COPING:  Patient and Christina [name] are coping well at this time. Christina [name] is pleased with patient's current status. Per Christina [name] and caregiver, patient is happy and smiling most of the time.  4. PERSONAL EMERGENCY PLAN:  Family and caregivers will call 9-1-1 for emergencies. 5. COMMUNITY RESOURCES COORDINATION/ HEALTH CARE NAVIGATION:  Christina [name] coordinates all of patient's care. Patient has hired caregivers that come to the home and help with care.   6. FINANCIAL/LEGAL CONCERNS/INTERVENTIONS:  None.     SOCIAL HX:  Social History   Tobacco Use  . Smoking status: Never Smoker  . Smokeless tobacco: Never Used  Substance Use Topics  . Alcohol use: Not Currently    Alcohol/week: 0.0 standard  drinks    CODE STATUS:   Code Status: Not on file  ADVANCED DIRECTIVES: N MOST FORM COMPLETE:  Yes. HOSPICE EDUCATION PROVIDED: None.  PPS: Patient is dependent of all of her ADLs.   I spent 60 minutes with patient/family, from2:30-3:30pmproviding education, support and consultation.   Margaretmary Lombard, LCSW

## 2019-01-19 NOTE — Progress Notes (Signed)
PATIENT NAME: Christina [name] DOB: May 20, 1951 MRN: [id number]  PRIMARY CARE PROVIDER: Virginia Crews, MD  RESPONSIBLE PARTY:  Acct ID - Guarantor Home Phone Work Phone Relationship Acct Type  [id number] - [name],[name]  Self P/F     [place], [place] [address]    PLAN OF CARE and INTERVENTIONS:               1.  GOALS OF CARE/ ADVANCE CARE PLANNING:  Reamin at home with husband Christina [name] with hired caregiver coming in 3 times per day to assist with care.               2.  PATIENT/CAREGIVER EDUCATION: Education on disease progression, education on increasing nutritional supplements to maintain adequate caloric intake.               3.  DISEASE STATUS: Patient is a 68 year old patient with dementia.  SW and RN made home visit.  RN completed CoV19 screening prior to visit and screening was negative.  [name] husband Christina [name] and hired caregiver present for visit.  All present as well as patient wore face masks.  Patient sitting in chair with feet elevated.  Patient alert and states "no" when nurse asked her if she is having pain.  Husband and caregiver report patient is more interactive today than she normally is.  Caregiver as well as patient's husband report patient interacts better with caregiver.  [name] arm measurement shows no muscle mass loss.  Right arm circumference 14 cm from elbow measures 26 cm.  Husband reports it continues to take 1 - 1 1/2 hours to feed patient meals.  Husband states he does give patient nutritional supplements.  Patient has not suffered any falls.  Caregiver Christina [name] comes to home 3 times per day to assist husband with patient's care.  Patient has not had any cough or shortness of breath.  Patient rubs feet against foam cushion on chair and caregiver has placed heel protectors on patient's heels. Patient remains dependent for all ADL's.  Patient is nonambulatory.  Patient appears comfortable.  Husband and caregiver deny having any concerns regarding patient at  the present time.  Husband and caregiver informed to call with questions or concerns.                   HISTORY OF PRESENT ILLNESS:    CODE STATUS: DNR  ADVANCED DIRECTIVES:Y MOST FORM: No PPS: 30%   PHYSICAL EXAM:   VITALS: See vital signs  LUNGS: clear to auscultation  CARDIAC: Cor RRR  EXTREMITIES: none edema SKIN: Skin color, texture, turgor normal. No rashes or lesions or varicosities - lower leg(s) bilateral  NEURO: positive for gait problems, memory problems and speech problems       Christina Simmer, RN

## 2019-01-23 DIAGNOSIS — E7800 Pure hypercholesterolemia, unspecified: Secondary | ICD-10-CM | POA: Diagnosis not present

## 2019-01-23 DIAGNOSIS — I779 Disorder of arteries and arterioles, unspecified: Secondary | ICD-10-CM | POA: Diagnosis not present

## 2019-01-23 DIAGNOSIS — E039 Hypothyroidism, unspecified: Secondary | ICD-10-CM | POA: Diagnosis not present

## 2019-01-23 DIAGNOSIS — F0280 Dementia in other diseases classified elsewhere without behavioral disturbance: Secondary | ICD-10-CM | POA: Diagnosis not present

## 2019-01-31 ENCOUNTER — Telehealth: Payer: Self-pay

## 2019-01-31 NOTE — Telephone Encounter (Signed)
SW received message from triage to follow-up with Gene, patient's husband.  SW contacted Gene. Gene has concerns with billing for hospital bed and requested SW follow-up with DME company. SW contacted [employer] on patient's behalf. Spoke with Pearson Grippe and she advised that there were no concerns or changes with billing noted in patient's account. SW notified Gene and encouraged Gene to contact insurance company directly to clarify letter or wait to see if he receives any additional letters. Gene agreed and was appreciative of support. Gene instructed to follow-up with SW if any other needs arise.

## 2019-02-05 DIAGNOSIS — E7800 Pure hypercholesterolemia, unspecified: Secondary | ICD-10-CM | POA: Diagnosis not present

## 2019-02-05 DIAGNOSIS — F0280 Dementia in other diseases classified elsewhere without behavioral disturbance: Secondary | ICD-10-CM | POA: Diagnosis not present

## 2019-02-05 DIAGNOSIS — E039 Hypothyroidism, unspecified: Secondary | ICD-10-CM | POA: Diagnosis not present

## 2019-02-05 DIAGNOSIS — G3183 Dementia with Lewy bodies: Secondary | ICD-10-CM | POA: Diagnosis not present

## 2019-02-09 ENCOUNTER — Telehealth: Payer: Self-pay | Admitting: Family Medicine

## 2019-02-09 NOTE — Telephone Encounter (Signed)
Christina [name]'s husband Gene called stating that her urine smells horrible and would like get a urine cup to have it checked in the morning.     She is demented and cant tell them if she is in pain, etc.

## 2019-02-13 NOTE — Telephone Encounter (Signed)
[name] husband reports that when he comes tomorrow for his labs that he is going to pick up the urine cup.

## 2019-02-13 NOTE — Telephone Encounter (Signed)
That is fine. Please do dip UA and send for culture. Send me the results and I will see if she needs an antibiotic.  She has recurrent UTIs.

## 2019-02-13 NOTE — Telephone Encounter (Signed)
Spoke with patient's husband and he reports that he just need her urine to be check he is going to stop by to get a urine cup to bring sample later.

## 2019-02-15 NOTE — Telephone Encounter (Signed)
Noted  

## 2019-02-15 NOTE — Telephone Encounter (Signed)
[name] husband (Gene) states that he came by today 02/15/2019 and picked up the urine cup. He states that patient did not want to have anything done now since the urine does not have a odor at the moment and she is not having any symptoms. He states if the patient starts having odor or any other symptoms he will notify Dr. B and bring her urine into the office.FYI

## 2019-02-16 ENCOUNTER — Telehealth (INDEPENDENT_AMBULATORY_CARE_PROVIDER_SITE_OTHER): Payer: PPO

## 2019-02-16 ENCOUNTER — Other Ambulatory Visit: Payer: Self-pay | Admitting: Family Medicine

## 2019-02-16 DIAGNOSIS — R8290 Unspecified abnormal findings in urine: Secondary | ICD-10-CM

## 2019-02-16 LAB — POCT URINALYSIS DIPSTICK
Bilirubin, UA: NEGATIVE
Blood, UA: NEGATIVE
Glucose, UA: NEGATIVE
Ketones, UA: NEGATIVE
Nitrite, UA: POSITIVE
Protein, UA: NEGATIVE
Spec Grav, UA: 1.01 (ref 1.010–1.025)
Urobilinogen, UA: 0.2 E.U./dL
pH, UA: 8 (ref 5.0–8.0)

## 2019-02-16 MED ORDER — CEPHALEXIN 500 MG PO CAPS: 500.0000 mg | ORAL_CAPSULE | Freq: Two times a day (BID) | ORAL | 0 refills | Status: AC

## 2019-02-16 NOTE — Telephone Encounter (Signed)
-----   Message from Virginia Crews, MD sent at 02/16/2019  3:34 PM EDT ----- UA looks infected again.  Will treat with Keflex 7d course.

## 2019-02-16 NOTE — Telephone Encounter (Signed)
Patient husband (Gene) was advised and states that he is going to pick up medication now.

## 2019-02-16 NOTE — Telephone Encounter (Signed)
[name] husband brought urine sample by today 02/16/2019 and stated that patient had started having UTI symptoms again. Urine culture & UA was done on urine. FYI

## 2019-02-17 ENCOUNTER — Telehealth: Payer: Self-pay

## 2019-02-17 NOTE — Telephone Encounter (Signed)
Telephone call to patient's husband Gene to schedule Palliative Care visit.  Husband in agreement with RN making home visit 02/20/19 at 2:00 PM.

## 2019-02-18 LAB — URINE CULTURE

## 2019-02-20 ENCOUNTER — Other Ambulatory Visit: Payer: PPO

## 2019-02-20 ENCOUNTER — Telehealth: Payer: Self-pay

## 2019-02-20 ENCOUNTER — Other Ambulatory Visit: Payer: Self-pay

## 2019-02-20 DIAGNOSIS — Z515 Encounter for palliative care: Secondary | ICD-10-CM

## 2019-02-20 NOTE — Progress Notes (Signed)
PATIENT NAME: Christina [name] DOB: 14-Dec-1950 MRN: [id number]  PRIMARY CARE PROVIDER: Virginia Crews, MD  RESPONSIBLE PARTY:  Acct ID - Guarantor Home Phone Work Phone Relationship Acct Type  [id number] - [name],[name]  Self P/F     [place],  [address]    PLAN OF CARE and INTERVENTIONS:               1.  GOALS OF CARE/ ADVANCE CARE PLANNING:  Remain in home with husband.                2.  PATIENT/CAREGIVER EDUCATION: Education on s/s of infection, review meds and meds to aide with constipation.                 3.  DISEASE STATUS: Patient is a 68 year old patient with dementia.  Patient resides at home with her husband Christina [name].  RN called and completed CoV19 screening and screening was negative.  Patient sitting in recliner with feet elevated.  Patient awake and husband reports patient goes to sleep after breakfast and sleeps until 2:00 PM.  Husband contines to have hired caregiver come in 3 times per day to assist patient with personal care.  Husband states he gives patient nutritional supplement daily if patient does not eat well.  Husband states it takes 1 1 1/2 hours to feed patient lunch and dinner meals.  Patient is currently being treated for a UTI and patient has had 2 UTI's this summer.  Husband and caregiver have been placing soft balls in patient's hands as patient is keeping her hands balled up.  Husband states patient will sometimes get choked on water.  Husband reports patient has issues with constipation and husband administers Miralax and MOM as needed.  RN reviewed patient's meds with husband.  Patient remains dependant for all ADL's.  Support provided to husband and husband instructed to call with questions or concerns.     HISTORY OF PRESENT ILLNESS:    CODE STATUS: DNR  ADVANCED DIRECTIVES: Y MOST FORM: No PPS: 30%   PHYSICAL EXAM:   VITALS: See vital signs  LUNGS: clear to auscultation  CARDIAC: Cor RRR  EXTREMITIES: none edema SKIN: Skin  color, texture, turgor normal. No rashes or lesions  NEURO: positive for gait problems, memory problems and speech problems       Nilda Simmer, RN

## 2019-02-20 NOTE — Telephone Encounter (Signed)
-----   Message from Virginia Crews, MD sent at 02/20/2019  9:31 AM EDT ----- Urine culture confirms UTI.  Has intermediate sensitivity to Keflex, abx that was Rx'd.  If she is doing better, ok to finish course of Keflex.  If not doing well, can switch to a different abx.

## 2019-02-20 NOTE — Telephone Encounter (Signed)
[name] husband (Gene) states that patient is doing a lot better with the medication. He states that the odor is gone and the urine is cleared up. FYI

## 2019-02-23 DIAGNOSIS — E7800 Pure hypercholesterolemia, unspecified: Secondary | ICD-10-CM | POA: Diagnosis not present

## 2019-02-23 DIAGNOSIS — I779 Disorder of arteries and arterioles, unspecified: Secondary | ICD-10-CM | POA: Diagnosis not present

## 2019-02-23 DIAGNOSIS — E039 Hypothyroidism, unspecified: Secondary | ICD-10-CM | POA: Diagnosis not present

## 2019-02-23 DIAGNOSIS — F0280 Dementia in other diseases classified elsewhere without behavioral disturbance: Secondary | ICD-10-CM | POA: Diagnosis not present

## 2019-02-28 DIAGNOSIS — H2513 Age-related nuclear cataract, bilateral: Secondary | ICD-10-CM | POA: Diagnosis not present

## 2019-03-02 ENCOUNTER — Other Ambulatory Visit (INDEPENDENT_AMBULATORY_CARE_PROVIDER_SITE_OTHER): Payer: PPO

## 2019-03-02 ENCOUNTER — Other Ambulatory Visit: Payer: Self-pay | Admitting: Family Medicine

## 2019-03-02 ENCOUNTER — Telehealth: Payer: Self-pay

## 2019-03-02 DIAGNOSIS — R8290 Unspecified abnormal findings in urine: Secondary | ICD-10-CM

## 2019-03-02 LAB — POCT URINALYSIS DIPSTICK
Bilirubin, UA: NEGATIVE
Blood, UA: NEGATIVE
Glucose, UA: NEGATIVE
Ketones, UA: NEGATIVE
Nitrite, UA: POSITIVE
Protein, UA: NEGATIVE
Spec Grav, UA: 1.015 (ref 1.010–1.025)
Urobilinogen, UA: 0.2 E.U./dL
pH, UA: 6 (ref 5.0–8.0)

## 2019-03-02 MED ORDER — CIPROFLOXACIN HCL 500 MG PO TABS: 500.0000 mg | ORAL_TABLET | Freq: Two times a day (BID) | ORAL | 0 refills | Status: DC

## 2019-03-02 MED ORDER — LEVOTHYROXINE SODIUM 50 MCG PO TABS: 50.0000 ug | ORAL_TABLET | Freq: Every day | ORAL | 1 refills | Status: DC

## 2019-03-02 NOTE — Telephone Encounter (Signed)
[name] husband advised. RX sent to pharmacy.

## 2019-03-02 NOTE — Telephone Encounter (Signed)
[facility] (was [facility])  faxed refill request for the following medications:  levothyroxine (SYNTHROID) 50 MCG tablet   Please advise.

## 2019-03-02 NOTE — Telephone Encounter (Signed)
[name] husband called stating that he will be bringing in a urine sample to be checked because of a possible UTI.

## 2019-03-02 NOTE — Telephone Encounter (Signed)
That's fine. Please do UA dip and send UCx

## 2019-03-02 NOTE — Telephone Encounter (Signed)
-----   Message from Virginia Crews, MD sent at 03/02/2019  2:28 PM EDT ----- Does appear to have UTI again.  Since we used Keflex so recently, will use Cipro instead.  OK to send Rx for Ciprofloxacin 500mg  tabs, take 1 tab PO BID x7d, #14 r0.  Culture was sent to confirm sensitivities

## 2019-03-04 LAB — URINE CULTURE

## 2019-03-08 DIAGNOSIS — E039 Hypothyroidism, unspecified: Secondary | ICD-10-CM | POA: Diagnosis not present

## 2019-03-08 DIAGNOSIS — F0280 Dementia in other diseases classified elsewhere without behavioral disturbance: Secondary | ICD-10-CM | POA: Diagnosis not present

## 2019-03-08 DIAGNOSIS — E7800 Pure hypercholesterolemia, unspecified: Secondary | ICD-10-CM | POA: Diagnosis not present

## 2019-03-08 DIAGNOSIS — G3183 Dementia with Lewy bodies: Secondary | ICD-10-CM | POA: Diagnosis not present

## 2019-03-21 ENCOUNTER — Other Ambulatory Visit: Payer: PPO

## 2019-03-21 ENCOUNTER — Other Ambulatory Visit: Payer: Self-pay

## 2019-03-21 ENCOUNTER — Telehealth: Payer: Self-pay

## 2019-03-21 DIAGNOSIS — Z515 Encounter for palliative care: Secondary | ICD-10-CM

## 2019-03-21 NOTE — Progress Notes (Signed)
COMMUNITY PALLIATIVE CARE SW NOTE  PATIENT NAME: Christina [name] DOB: 06/10/1951 MRN: [id number]  PRIMARY CARE PROVIDER: Virginia Crews, MD  RESPONSIBLE PARTY:  Acct ID - Guarantor Home Phone Work Phone Relationship Acct Type  [id number] - [name],[name]  Self P/F     [place], [place], [place] [address]     PLAN OF CARE and INTERVENTIONS:             1. GOALS OF CARE/ ADVANCE CARE PLANNING:  Patient has DNR form in the home. Patient also has Living Will. Goal is comfort care.  2. SOCIAL/EMOTIONAL/SPIRITUAL ASSESSMENT/ INTERVENTIONS:  SW and RN met with patient and husband, Christina [name] in their home. Patient was smiling at staff, and attentive. Patient was laying back in chair, appeared comfortable. No pain concerns. Patient spends most of the day in the chair, watching television. Christina [name] said patient is eating better and enjoys sweets. Patient was able to get out to the ophthalmologist recently, no concerns per Christina [name]. Christina [name] said they enjoyed the visit out. 3. PATIENT/CAREGIVER EDUCATION/ COPING:  Patient and Christina [name] are coping well. Christina [name] feels patient is doing "fine". No concerns at this time. 4. PERSONAL EMERGENCY PLAN:  Family and caregivers will call 9-1-1 for emergencies. 5. COMMUNITY RESOURCES COORDINATION/ HEALTH CARE NAVIGATION:  Christina [name] coordinates all patient care. Patient has hired caregivers that come to the home and help with care at home. Christina [name] denies concerns. 6. FINANCIAL/LEGAL CONCERNS/INTERVENTIONS:  No needs.      SOCIAL HX:  Social History   Tobacco Use  . Smoking status: Never Smoker  . Smokeless tobacco: Never Used  Substance Use Topics  . Alcohol use: Not Currently    Alcohol/week: 0.0 standard drinks    CODE STATUS:   Code Status: Not on file (DNR) ADVANCED DIRECTIVES: Y - Living Will. MOST FORM COMPLETE:  No. HOSPICE EDUCATION PROVIDED: None.  PPS: Patient is dependent of all of her ADLs.  Due to patient's stability, patient will no longer be followed by  [facility] team at this time. Patient will remain with [facility] team and be followed by NP. Patient/family agreeable to plan, no questions/concerns. Christina [name] confirmed that he will contact [facility] team if anything changes. I spent 60 minutes with patient/family, from 1:00-2:00p providing education, support and consultation.    Christina Lombard, LCSW

## 2019-03-21 NOTE — Progress Notes (Signed)
PATIENT NAME: Christina [name] DOB: [birth year] MRN: [id number]  PRIMARY CARE PROVIDER: Virginia Crews, MD  RESPONSIBLE PARTY:  Acct ID - Guarantor Home Phone Work Phone Relationship Acct Type  [id number] - Lasalle,[name]  Self P/F     [place], [place], [place] [address]    PLAN OF CARE and INTERVENTIONS:               1.  GOALS OF CARE/ ADVANCE CARE PLANNING:  Remain at home with husband.               2.  PATIENT/CAREGIVER EDUCATION:  Education on fall precautions when transferring patient, education on s/s of infection.               3.  DISEASE STATUS: Patient is a 68 year old patient who continues to reside at home with her husband Gene.  Patient has Dementia, CAD, HTN, Hypothyroidism.   HISTORY OF PRESENT ILLNESS:  SW and RN made home visit today. Patient sitting in recliner with feet elevated. Patients husband Gene present for visit. Gene states hired caregiver Rodena Piety continues to come to home 3 times per day and provides incontinent and personal care to patient. Patient smiling and repeats a few words when nurse and social worker talks with her. Gene reports they use Hoyer lift to get patient out of bed and up from chair. Patient gets very stiff. Patient is dependent for all ADL's.   Patient has not suffered any falls. Gene reports patient has been eating well lately however it continues to take at least an hour or more to feed patient meals. Gene also reports patient continues to have issues with constipation. Gene administers milk of magnesia if patients bowels have not moved in 2 days. Patient has mornings where she is sleeping later. Gene concerned patient may lay in bed awake at night. Gene reports he has been administering one tablet of arthritis Tylenol to patient everyday. Romie Minus also reports they have been putting squeeze balls in patients hands so patient hands do not contract. Patient has not showed any changes in several months. Gene in agreement with NP following patient  rather than [facility] team doing monthly visits as patient is showing no decline. Gene informed should patient have a change in status [facility] team would return.   Support provided to Gene. Gene encouraged to contact palliative care team with any questions or concerns.   CODE STATUS: DNR  ADVANCED DIRECTIVES: Y MOST FORM: No PPS: 30%   PHYSICAL EXAM:   VITALS: See vital signs  LUNGS: clear to auscultation  CARDIAC: Cor RRR  EXTREMITIES: none edema SKIN: Skin color, texture, turgor normal. No rashes or lesions  NEURO: positive for gait problems and memory problems       Nilda Simmer, RN

## 2019-03-21 NOTE — Telephone Encounter (Signed)
Telephone call to patient's husband Christina [name].  Christina [name] in agreement with home palliative care visit today at 1:00 PM.

## 2019-03-26 DIAGNOSIS — E039 Hypothyroidism, unspecified: Secondary | ICD-10-CM | POA: Diagnosis not present

## 2019-03-26 DIAGNOSIS — F0280 Dementia in other diseases classified elsewhere without behavioral disturbance: Secondary | ICD-10-CM | POA: Diagnosis not present

## 2019-03-26 DIAGNOSIS — I779 Disorder of arteries and arterioles, unspecified: Secondary | ICD-10-CM | POA: Diagnosis not present

## 2019-03-26 DIAGNOSIS — E7800 Pure hypercholesterolemia, unspecified: Secondary | ICD-10-CM | POA: Diagnosis not present

## 2019-03-29 ENCOUNTER — Other Ambulatory Visit: Payer: Self-pay | Admitting: Family Medicine

## 2019-03-29 MED ORDER — SERTRALINE HCL 100 MG PO TABS: 100.0000 mg | ORAL_TABLET | Freq: Every day | ORAL | 3 refills | Status: DC

## 2019-03-29 NOTE — Telephone Encounter (Signed)
[facility] faxed refill request for the following medications:  sertraline (ZOLOFT) 100 MG tablet    Please advise.

## 2019-04-05 ENCOUNTER — Telehealth: Payer: Self-pay

## 2019-04-05 ENCOUNTER — Other Ambulatory Visit: Payer: Self-pay | Admitting: Family Medicine

## 2019-04-05 ENCOUNTER — Telehealth (INDEPENDENT_AMBULATORY_CARE_PROVIDER_SITE_OTHER): Payer: PPO

## 2019-04-05 DIAGNOSIS — N390 Urinary tract infection, site not specified: Secondary | ICD-10-CM

## 2019-04-05 LAB — POCT URINALYSIS DIPSTICK
Bilirubin, UA: NEGATIVE
Glucose, UA: NEGATIVE
Ketones, UA: NEGATIVE
Nitrite, UA: POSITIVE
Protein, UA: NEGATIVE
Spec Grav, UA: 1.01 (ref 1.010–1.025)
Urobilinogen, UA: 0.2 E.U./dL
pH, UA: 6.5 (ref 5.0–8.0)

## 2019-04-05 MED ORDER — CEPHALEXIN 500 MG PO CAPS: 500.0000 mg | ORAL_CAPSULE | Freq: Two times a day (BID) | ORAL | 0 refills | Status: AC

## 2019-04-05 NOTE — Telephone Encounter (Signed)
Mr. Fluegel advised.   Thanks,   -Mickel Baas

## 2019-04-05 NOTE — Telephone Encounter (Signed)
-----   Message from Virginia Crews, MD sent at 04/05/2019 11:36 AM EDT ----- Appears to have UTI again.  Will send for culture to confirm.  Will treat with Keflex x7 days (Rx sent ot pharmacy)

## 2019-04-05 NOTE — Telephone Encounter (Signed)
Mr. Artale comes in today to drop off Mrs. Pursell's urine.  He states he noticed a foul odor a few days ago, but she is not running a fever.  She is not able to communicate other symptoms.  Thanks,   -Mickel Baas

## 2019-04-08 DIAGNOSIS — F0280 Dementia in other diseases classified elsewhere without behavioral disturbance: Secondary | ICD-10-CM | POA: Diagnosis not present

## 2019-04-08 DIAGNOSIS — G3183 Dementia with Lewy bodies: Secondary | ICD-10-CM | POA: Diagnosis not present

## 2019-04-08 DIAGNOSIS — E7800 Pure hypercholesterolemia, unspecified: Secondary | ICD-10-CM | POA: Diagnosis not present

## 2019-04-08 DIAGNOSIS — E039 Hypothyroidism, unspecified: Secondary | ICD-10-CM | POA: Diagnosis not present

## 2019-04-10 ENCOUNTER — Telehealth: Payer: Self-pay

## 2019-04-10 LAB — CULTURE, URINE COMPREHENSIVE

## 2019-04-10 LAB — SPECIMEN STATUS REPORT

## 2019-04-10 MED ORDER — NITROFURANTOIN MONOHYD MACRO 100 MG PO CAPS: 100.0000 mg | ORAL_CAPSULE | Freq: Every day | ORAL | 2 refills | Status: DC

## 2019-04-10 NOTE — Telephone Encounter (Signed)
-----   Message from Virginia Crews, MD sent at 04/10/2019  9:09 AM EDT ----- Urine culture confirms significant amount of bacteria, but no one species is predominant, so no sensitivities are reported.  Would finish Keflex as prescribed.  Given that she has had so many UTIs recently, it may be a good idea to think about putting her on a daily low-dose antibiotic to prevent UTIs in the future.  I wonder what her husband's thoughts about this are

## 2019-04-10 NOTE — Telephone Encounter (Signed)
Mr. Whillock advised.  He states he would like to start Mrs. Munns on a daily antibiotic.  Please send to [facility].   Thanks,   -Mickel Baas

## 2019-04-10 NOTE — Telephone Encounter (Signed)
[name] husband (Gene) was advised and states that he will started the medication after patient finish up her Keflex.

## 2019-04-10 NOTE — Telephone Encounter (Signed)
Once she has finished course of Keflex for this current UTI, she can start taking Macrobid daily at bedtime to prevent further UTIs.  This does not mean that she will never get another one, but hopefully this will decrease their frequency

## 2019-04-19 ENCOUNTER — Ambulatory Visit (INDEPENDENT_AMBULATORY_CARE_PROVIDER_SITE_OTHER): Payer: PPO

## 2019-04-19 ENCOUNTER — Other Ambulatory Visit: Payer: Self-pay

## 2019-04-19 DIAGNOSIS — Z23 Encounter for immunization: Secondary | ICD-10-CM | POA: Diagnosis not present

## 2019-04-25 DIAGNOSIS — E039 Hypothyroidism, unspecified: Secondary | ICD-10-CM | POA: Diagnosis not present

## 2019-04-25 DIAGNOSIS — I779 Disorder of arteries and arterioles, unspecified: Secondary | ICD-10-CM | POA: Diagnosis not present

## 2019-04-25 DIAGNOSIS — E7800 Pure hypercholesterolemia, unspecified: Secondary | ICD-10-CM | POA: Diagnosis not present

## 2019-04-25 DIAGNOSIS — F0280 Dementia in other diseases classified elsewhere without behavioral disturbance: Secondary | ICD-10-CM | POA: Diagnosis not present

## 2019-05-01 ENCOUNTER — Telehealth: Payer: Self-pay | Admitting: *Deleted

## 2019-05-01 MED ORDER — NITROFURANTOIN MONOHYD MACRO 100 MG PO CAPS: 100.0000 mg | ORAL_CAPSULE | Freq: Every day | ORAL | 3 refills | Status: DC

## 2019-05-01 NOTE — Telephone Encounter (Signed)
OK to send in 90 day supply to requested pharmacy with 3 refills

## 2019-05-01 NOTE — Telephone Encounter (Signed)
RX sent to [facility] order

## 2019-05-01 NOTE — Telephone Encounter (Signed)
[name] husband Gene called requesting we send 90 day supply of rx nitrofurantoin 100 mg to mail order pharmacy. Gene stated patient seems to be doing well on medication, with no adverse reactions. Gene would like to get the mail order rx started because this is cheaper for them. Please advise refill? [facility].

## 2019-05-05 ENCOUNTER — Other Ambulatory Visit: Payer: Self-pay | Admitting: Family Medicine

## 2019-05-05 MED ORDER — LEVOTHYROXINE SODIUM 50 MCG PO TABS: 50.0000 ug | ORAL_TABLET | Freq: Every day | ORAL | 1 refills | Status: DC

## 2019-05-05 NOTE — Telephone Encounter (Signed)
[facility] faxed refill request for the following medications:  levothyroxine (SYNTHROID) 50 MCG tablet   Please advise.  

## 2019-05-08 DIAGNOSIS — E7800 Pure hypercholesterolemia, unspecified: Secondary | ICD-10-CM | POA: Diagnosis not present

## 2019-05-08 DIAGNOSIS — G3183 Dementia with Lewy bodies: Secondary | ICD-10-CM | POA: Diagnosis not present

## 2019-05-08 DIAGNOSIS — E039 Hypothyroidism, unspecified: Secondary | ICD-10-CM | POA: Diagnosis not present

## 2019-05-08 DIAGNOSIS — F0280 Dementia in other diseases classified elsewhere without behavioral disturbance: Secondary | ICD-10-CM | POA: Diagnosis not present

## 2019-05-11 ENCOUNTER — Telehealth: Payer: Self-pay | Admitting: Nurse Practitioner

## 2019-05-11 NOTE — Telephone Encounter (Signed)
Spoke with patient's husband Gene and have scheduled a Telephone Palliative visit with NP for 06/01/19 @ 3 PM

## 2019-05-26 DIAGNOSIS — F0280 Dementia in other diseases classified elsewhere without behavioral disturbance: Secondary | ICD-10-CM | POA: Diagnosis not present

## 2019-05-26 DIAGNOSIS — E7800 Pure hypercholesterolemia, unspecified: Secondary | ICD-10-CM | POA: Diagnosis not present

## 2019-05-26 DIAGNOSIS — E039 Hypothyroidism, unspecified: Secondary | ICD-10-CM | POA: Diagnosis not present

## 2019-05-26 DIAGNOSIS — I779 Disorder of arteries and arterioles, unspecified: Secondary | ICD-10-CM | POA: Diagnosis not present

## 2019-06-01 ENCOUNTER — Other Ambulatory Visit: Payer: PPO | Admitting: Nurse Practitioner

## 2019-06-01 ENCOUNTER — Encounter: Payer: Self-pay | Admitting: Nurse Practitioner

## 2019-06-01 ENCOUNTER — Other Ambulatory Visit: Payer: Self-pay

## 2019-06-01 ENCOUNTER — Other Ambulatory Visit: Payer: Self-pay | Admitting: Family Medicine

## 2019-06-01 DIAGNOSIS — F0390 Unspecified dementia without behavioral disturbance: Secondary | ICD-10-CM

## 2019-06-01 DIAGNOSIS — Z515 Encounter for palliative care: Secondary | ICD-10-CM

## 2019-06-01 MED ORDER — SERTRALINE HCL 100 MG PO TABS: 100.0000 mg | ORAL_TABLET | Freq: Every day | ORAL | 3 refills | Status: DC

## 2019-06-01 NOTE — Telephone Encounter (Signed)
[facility] faxed refill request for the following medications:  sertraline (ZOLOFT) 100 MG tablet   Please advise.
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
## 2019-06-08 ENCOUNTER — Telehealth: Payer: Self-pay | Admitting: Family Medicine

## 2019-06-08 ENCOUNTER — Telehealth: Payer: Self-pay | Admitting: Nurse Practitioner

## 2019-06-08 DIAGNOSIS — F0280 Dementia in other diseases classified elsewhere without behavioral disturbance: Secondary | ICD-10-CM | POA: Diagnosis not present

## 2019-06-08 DIAGNOSIS — G3183 Dementia with Lewy bodies: Secondary | ICD-10-CM | POA: Diagnosis not present

## 2019-06-08 DIAGNOSIS — E039 Hypothyroidism, unspecified: Secondary | ICD-10-CM | POA: Diagnosis not present

## 2019-06-08 DIAGNOSIS — E7800 Pure hypercholesterolemia, unspecified: Secondary | ICD-10-CM | POA: Diagnosis not present

## 2019-06-08 NOTE — Telephone Encounter (Signed)
From [facility] 

## 2019-06-08 NOTE — Telephone Encounter (Signed)
Altha Harm, NP from [facility] calling to speak with Dr. Brita Romp.  States that they need hospice orders on this pt. Can call her or fax over to: [phone].

## 2019-06-08 NOTE — Telephone Encounter (Signed)
Rx for Hospice referral ready to be faxed

## 2019-06-08 NOTE — Telephone Encounter (Signed)
Rx faxed to [phone]

## 2019-06-08 NOTE — Telephone Encounter (Signed)
I called Mr. Lerner, Christina [name]'s husband for further discussion of hospice eligibility. Case discussed with [facility] found to be eligible for Hospice Services at home. I called Mr [place]an[place], discussed update from hospice Physicians. We talked about Hospice Services Under Medicare benefit and what is offered. Mr Men endorses he would like to have Hospice Services for Christina [name]. I contacted [facility] to get hospice order. Questions answered the satisfaction. Contact information provided. Therapeutic listening and emotional support provided.  Total time spent 20 minutes  Documentation 5 minutes Phone discussion 15 minutes

## 2019-06-09 DIAGNOSIS — I69818 Other symptoms and signs involving cognitive functions following other cerebrovascular disease: Secondary | ICD-10-CM | POA: Diagnosis not present

## 2019-06-09 DIAGNOSIS — M1990 Unspecified osteoarthritis, unspecified site: Secondary | ICD-10-CM | POA: Diagnosis not present

## 2019-06-09 DIAGNOSIS — R011 Cardiac murmur, unspecified: Secondary | ICD-10-CM | POA: Diagnosis not present

## 2019-06-09 DIAGNOSIS — H259 Unspecified age-related cataract: Secondary | ICD-10-CM | POA: Diagnosis not present

## 2019-06-09 DIAGNOSIS — M519 Unspecified thoracic, thoracolumbar and lumbosacral intervertebral disc disorder: Secondary | ICD-10-CM | POA: Diagnosis not present

## 2019-06-09 DIAGNOSIS — I69891 Dysphagia following other cerebrovascular disease: Secondary | ICD-10-CM | POA: Diagnosis not present

## 2019-06-09 DIAGNOSIS — E039 Hypothyroidism, unspecified: Secondary | ICD-10-CM | POA: Diagnosis not present

## 2019-06-09 DIAGNOSIS — Z792 Long term (current) use of antibiotics: Secondary | ICD-10-CM | POA: Diagnosis not present

## 2019-06-09 DIAGNOSIS — E785 Hyperlipidemia, unspecified: Secondary | ICD-10-CM | POA: Diagnosis not present

## 2019-06-09 DIAGNOSIS — F0280 Dementia in other diseases classified elsewhere without behavioral disturbance: Secondary | ICD-10-CM | POA: Diagnosis not present

## 2019-06-09 DIAGNOSIS — I10 Essential (primary) hypertension: Secondary | ICD-10-CM | POA: Diagnosis not present

## 2019-06-09 DIAGNOSIS — I6529 Occlusion and stenosis of unspecified carotid artery: Secondary | ICD-10-CM | POA: Diagnosis not present

## 2019-06-09 DIAGNOSIS — Z87440 Personal history of urinary (tract) infections: Secondary | ICD-10-CM | POA: Diagnosis not present

## 2019-06-09 DIAGNOSIS — G3109 Other frontotemporal dementia: Secondary | ICD-10-CM | POA: Diagnosis not present

## 2019-06-09 DIAGNOSIS — Z8619 Personal history of other infectious and parasitic diseases: Secondary | ICD-10-CM | POA: Diagnosis not present

## 2019-06-09 DIAGNOSIS — F419 Anxiety disorder, unspecified: Secondary | ICD-10-CM | POA: Diagnosis not present

## 2019-06-09 DIAGNOSIS — F329 Major depressive disorder, single episode, unspecified: Secondary | ICD-10-CM | POA: Diagnosis not present

## 2019-06-12 ENCOUNTER — Telehealth: Payer: Self-pay | Admitting: Family Medicine

## 2019-06-12 DIAGNOSIS — I69818 Other symptoms and signs involving cognitive functions following other cerebrovascular disease: Secondary | ICD-10-CM | POA: Diagnosis not present

## 2019-06-12 DIAGNOSIS — E785 Hyperlipidemia, unspecified: Secondary | ICD-10-CM | POA: Diagnosis not present

## 2019-06-12 DIAGNOSIS — F0280 Dementia in other diseases classified elsewhere without behavioral disturbance: Secondary | ICD-10-CM | POA: Diagnosis not present

## 2019-06-12 DIAGNOSIS — I10 Essential (primary) hypertension: Secondary | ICD-10-CM | POA: Diagnosis not present

## 2019-06-12 DIAGNOSIS — I6529 Occlusion and stenosis of unspecified carotid artery: Secondary | ICD-10-CM | POA: Diagnosis not present

## 2019-06-12 DIAGNOSIS — G3109 Other frontotemporal dementia: Secondary | ICD-10-CM | POA: Diagnosis not present

## 2019-06-12 NOTE — Telephone Encounter (Signed)
Christina [name] with Hospice given verbal orders. Christina [name] states Hospice does not do routine labs because patient is on comfort care. Christina [name] would like a call back if to let let her know if these labs are being done to check something specifically. CB# [phone]

## 2019-06-12 NOTE — Telephone Encounter (Signed)
Otila Kluver wth hospice states the husband would like the pt of come off of the zoloft. Pt needs instructions on how to taper off.  Also pt never got her labs on 10/12/2018.  Can hospice get an order for these labs?(and any other labs you think pt may need)  That way pt will not have to come in.  Please cal her nurse April with a verbal at [phone]

## 2019-06-12 NOTE — Telephone Encounter (Signed)
As she is currently taking Zoloft 100 mg daily, she can cut back to 50 mg daily for 1 week and then take 50 mg every other day for 1 week and then discontinue.  The pill can be cut in half, so she can use the supply that she has  Appears that she was supposed to have hemoglobin A1c, CMP, TSH done in 09/2018.  We can give verbal orders for these as well as CBC and B12.

## 2019-06-13 NOTE — Telephone Encounter (Signed)
LMTCB 06/13/2019  Thanks,   -Mickel Baas

## 2019-06-13 NOTE — Telephone Encounter (Signed)
Sorry.  I misunderstood.  Those were the labs that were ordered back in March.  The only thing that I recommend getting on her is the TSH as she is on Synthroid

## 2019-06-13 NOTE — Telephone Encounter (Signed)
April with Hospice advised.   Thanks,   -Mickel Baas

## 2019-06-14 ENCOUNTER — Encounter: Payer: Self-pay | Admitting: Family Medicine

## 2019-06-14 ENCOUNTER — Other Ambulatory Visit: Payer: Self-pay

## 2019-06-16 DIAGNOSIS — G3109 Other frontotemporal dementia: Secondary | ICD-10-CM | POA: Diagnosis not present

## 2019-06-16 DIAGNOSIS — I10 Essential (primary) hypertension: Secondary | ICD-10-CM | POA: Diagnosis not present

## 2019-06-16 DIAGNOSIS — I69818 Other symptoms and signs involving cognitive functions following other cerebrovascular disease: Secondary | ICD-10-CM | POA: Diagnosis not present

## 2019-06-16 DIAGNOSIS — F0280 Dementia in other diseases classified elsewhere without behavioral disturbance: Secondary | ICD-10-CM | POA: Diagnosis not present

## 2019-06-16 DIAGNOSIS — I6529 Occlusion and stenosis of unspecified carotid artery: Secondary | ICD-10-CM | POA: Diagnosis not present

## 2019-06-16 DIAGNOSIS — E785 Hyperlipidemia, unspecified: Secondary | ICD-10-CM | POA: Diagnosis not present

## 2019-06-19 DIAGNOSIS — I10 Essential (primary) hypertension: Secondary | ICD-10-CM | POA: Diagnosis not present

## 2019-06-19 DIAGNOSIS — F0280 Dementia in other diseases classified elsewhere without behavioral disturbance: Secondary | ICD-10-CM | POA: Diagnosis not present

## 2019-06-19 DIAGNOSIS — I6529 Occlusion and stenosis of unspecified carotid artery: Secondary | ICD-10-CM | POA: Diagnosis not present

## 2019-06-19 DIAGNOSIS — I69818 Other symptoms and signs involving cognitive functions following other cerebrovascular disease: Secondary | ICD-10-CM | POA: Diagnosis not present

## 2019-06-19 DIAGNOSIS — G3109 Other frontotemporal dementia: Secondary | ICD-10-CM | POA: Diagnosis not present

## 2019-06-19 DIAGNOSIS — E785 Hyperlipidemia, unspecified: Secondary | ICD-10-CM | POA: Diagnosis not present

## 2019-06-20 DIAGNOSIS — Z7689 Persons encountering health services in other specified circumstances: Secondary | ICD-10-CM | POA: Diagnosis not present

## 2019-06-20 LAB — TSH: TSH: 2.37 (ref 0.41–5.90)

## 2019-06-22 ENCOUNTER — Telehealth: Payer: Self-pay

## 2019-06-22 NOTE — Telephone Encounter (Signed)
Left detailed message for April

## 2019-06-22 NOTE — Telephone Encounter (Signed)
From [facility]  Copied from [place] [phone]. Topic: General - Other >> Jun 22, 2019  2:35 PM Greggory Keen D wrote: Reason for CRM: April with Hospice called  wanting to know if we have received a fax stating patients TSH level.  She said they faxed it yesterday.  Pt's level was 2.370.   Please Advise  [phone]

## 2019-06-22 NOTE — Telephone Encounter (Signed)
We did. Gave it to CMA to abstract and let the patient know it is normal  No change to medications. Can let Hospice know as well

## 2019-06-23 ENCOUNTER — Encounter: Payer: Self-pay | Admitting: Family Medicine

## 2019-06-25 DIAGNOSIS — E7800 Pure hypercholesterolemia, unspecified: Secondary | ICD-10-CM | POA: Diagnosis not present

## 2019-06-25 DIAGNOSIS — F0280 Dementia in other diseases classified elsewhere without behavioral disturbance: Secondary | ICD-10-CM | POA: Diagnosis not present

## 2019-06-25 DIAGNOSIS — I779 Disorder of arteries and arterioles, unspecified: Secondary | ICD-10-CM | POA: Diagnosis not present

## 2019-06-25 DIAGNOSIS — E039 Hypothyroidism, unspecified: Secondary | ICD-10-CM | POA: Diagnosis not present

## 2019-06-29 ENCOUNTER — Other Ambulatory Visit: Payer: Self-pay | Admitting: Family Medicine

## 2019-06-29 MED ORDER — LISINOPRIL 10 MG PO TABS: 10.0000 mg | ORAL_TABLET | Freq: Every day | ORAL | 3 refills | Status: DC

## 2019-06-29 NOTE — Telephone Encounter (Signed)
[facility]balist faxed refill request for the following medications:   lisinopril (PRINIVIL,ZESTRIL) 10 MG tablet - needing new refills   Please advise.  Thanks, [employer]

## 2019-07-08 DIAGNOSIS — G3183 Dementia with Lewy bodies: Secondary | ICD-10-CM | POA: Diagnosis not present

## 2019-07-08 DIAGNOSIS — E7800 Pure hypercholesterolemia, unspecified: Secondary | ICD-10-CM | POA: Diagnosis not present

## 2019-07-08 DIAGNOSIS — E039 Hypothyroidism, unspecified: Secondary | ICD-10-CM | POA: Diagnosis not present

## 2019-07-08 DIAGNOSIS — F0280 Dementia in other diseases classified elsewhere without behavioral disturbance: Secondary | ICD-10-CM | POA: Diagnosis not present

## 2019-07-11 ENCOUNTER — Other Ambulatory Visit: Payer: PPO | Admitting: Nurse Practitioner

## 2019-08-08 DIAGNOSIS — E7800 Pure hypercholesterolemia, unspecified: Secondary | ICD-10-CM | POA: Diagnosis not present

## 2019-08-08 DIAGNOSIS — G3183 Dementia with Lewy bodies: Secondary | ICD-10-CM | POA: Diagnosis not present

## 2019-08-08 DIAGNOSIS — E039 Hypothyroidism, unspecified: Secondary | ICD-10-CM | POA: Diagnosis not present

## 2019-08-08 DIAGNOSIS — F0280 Dementia in other diseases classified elsewhere without behavioral disturbance: Secondary | ICD-10-CM | POA: Diagnosis not present

## 2019-08-09 ENCOUNTER — Telehealth: Payer: Self-pay

## 2019-08-09 NOTE — Telephone Encounter (Signed)
It is safe for her to have COVID19 vaccine.    [place] is currently in phase 1B group one of our vaccination plan for COVID-19.  You can check with the [place] to see the breakdown of the priority groups and where you fall in this.  At this time, [facility] does not have any COVID-19 vaccines.  We do not know if or when or how we may have COVID-19 vaccines in the future.  If you are in one of the priority groups that is eligible for vaccination at this time, I recommend that you check with your local health department for vaccine availability.  [facility] is also partnering with the [facility] health department to provide vaccinations at the [facility].  You are eligible for these even if you are not a [facility] resident.  Please be patient when trying to schedule these appointments, as there are a lot of peple trying to get appointments at this time  The website to get more information about [place]'s vaccination efforts or to sign up for vaccine appointment or to get on the waitlist is:  [email or web address].co.uk  You should still get the vaccine if you: have antibiotic allergies, have an autoimmune condition (check with your rheumatologist about how to time your meds), or have cancer.  Everyone should expect to be monitored for 15 minutes after their vaccine; people with a history of additional significant allergy may be monitored for 30 minutes.  Currently the only contraindication is allergy to one of the vaccine components themselves.  People under the age of 21 are not yet approved for vaccination but trials are ongoing.  If you have questions about the different types of vaccinations, including how they work and what is in them and how they have been studied, this information is available through the [facility].   [email or web address].huang.com/.html  You do still need to wear a mask and avoid large crowds after being vaccinated.

## 2019-08-09 NOTE — Telephone Encounter (Signed)
Patient advised as below.  

## 2019-08-09 NOTE — Telephone Encounter (Signed)
Copied from [place] [id number]. Topic: General - Other >> Aug 09, 2019  1:39 PM Keene Breath wrote: Reason for CRM: [name] husband wants to ask if it is safe for patient to have the COVID vaccine.  CB# [phone]

## 2019-08-14 ENCOUNTER — Telehealth: Payer: Self-pay | Admitting: Family Medicine

## 2019-08-14 NOTE — Telephone Encounter (Signed)
Ok to give verbal for COVID testing with PCR on day 5-7 from exposure.

## 2019-08-14 NOTE — Telephone Encounter (Signed)
April, pts nurse, called stating that the pt is under hospice care. April states that pts husband had an exposure and is requesting to have the pt tested. April is needing an order placed to have the pt tested. April states that the exposure happened on 08/08/19. Please advise.    [phone]

## 2019-08-14 NOTE — Telephone Encounter (Signed)
April advised.   Thanks,   -Mickel Baas

## 2019-09-04 ENCOUNTER — Other Ambulatory Visit: Payer: Self-pay | Admitting: Family Medicine

## 2019-09-04 NOTE — Telephone Encounter (Signed)
Requested medication (s) are due for refill today   Yes  Requested medication (s) are on the active medication list Yes  Future visit scheduled   No.  Patient is on Palliative Care with home visits. This medication  has been previously prescribed by historical provider.  Routing for updated provider.   Requested Prescriptions  Pending Prescriptions Disp Refills   ASPIRIN LOW DOSE 81 MG EC tablet [Pharmacy Med Name: ASPIRIN LOW DOSE 81 MG DR TAB] 15 tablet     Sig: TAKE 1 TABLET BY MOUTH EACH EVENING      Analgesics:  NSAIDS - aspirin Passed - 09/04/2019  3:44 PM      Passed - Patient is not pregnant      Passed - Valid encounter within last 12 months    Recent Outpatient Visits           10 months ago Acquired hypothyroidism   [name] [place], Dionne Bucy, MD   10 months ago Erroneous encounter - disregard   [facility] [place], Dionne Bucy, MD   1 year ago Acquired hypothyroidism   [facility] [place], Dionne Bucy, MD

## 2019-11-01 ENCOUNTER — Encounter: Payer: Self-pay | Admitting: Family Medicine

## 2019-12-04 ENCOUNTER — Telehealth: Payer: Self-pay

## 2019-12-04 ENCOUNTER — Other Ambulatory Visit: Payer: Self-pay | Admitting: Family Medicine

## 2019-12-04 NOTE — Telephone Encounter (Signed)
Is it okay to cancel the wellness visit?

## 2019-12-04 NOTE — Telephone Encounter (Signed)
Yes. Please cancel. Make sure McKenzie knows to leave her off the list for future AWVs

## 2019-12-04 NOTE — Telephone Encounter (Signed)
Copied from [place] [phone]. Topic: Appointment Scheduling - Scheduling Inquiry for Clinic >> Dec 04, 2019  3:56 PM Sheran Luz wrote: April with Hospice calling to inquire if upcoming medicare well visit is necessary as patient is bed bound. April also requesting call back from CMA to discuss medications.

## 2019-12-04 NOTE — Telephone Encounter (Signed)
Requested medication (s) are due for refill today - yes  Requested medication (s) are on the active medication list -yes  Future visit scheduled -yes  Last refill: 11/20/19  Notes to clinic: Patient fails visit protocol- call to husband and patient has been scheduled for Friday. Request for medication not assigned to protocol.  Requested Prescriptions  Pending Prescriptions Disp Refills   nitrofurantoin, macrocrystal-monohydrate, (MACROBID) 100 MG capsule [Pharmacy Med Name: NITROFURANTOIN MONOHYD MACRO 100 MG] 15 capsule     Sig: TAKE 1 CAPSULE BY MOUTH EVERY NIGHT AT BEDTIME      Off-Protocol Failed - 12/04/2019  1:32 PM      Failed - Medication not assigned to a protocol, review manually.      Failed - Valid encounter within last 12 months    Recent Outpatient Visits           1 year ago Acquired hypothyroidism   [facility] [place], Dionne Bucy, MD   1 year ago Erroneous encounter - disregard   [facility] [place], Dionne Bucy, MD   1 year ago Acquired hypothyroidism   [facility] [place], Dionne Bucy, MD                Melatonin 10 MG TBCR [Pharmacy Med Name: MELATONIN ER 10 MG TAB] 15 tablet     Sig: TAKE 1 TABLET BY MOUTH EVERY NIGHT AT BEDTIME      Over the Counter:  OTC Failed - 12/04/2019  1:32 PM      Failed - Valid encounter within last 12 months    Recent Outpatient Visits           1 year ago Acquired hypothyroidism   [facility] [place], Dionne Bucy, MD   1 year ago Erroneous encounter - disregard   [facility] [place], Dionne Bucy, MD   1 year ago Acquired hypothyroidism   [facility] [place], Dionne Bucy, MD                  Requested Prescriptions  Pending Prescriptions Disp Refills   nitrofurantoin, macrocrystal-monohydrate, (MACROBID) 100 MG capsule [Pharmacy Med Name: NITROFURANTOIN MONOHYD MACRO 100 MG] 15 capsule     Sig: TAKE 1 CAPSULE BY MOUTH  EVERY NIGHT AT BEDTIME      Off-Protocol Failed - 12/04/2019  1:32 PM      Failed - Medication not assigned to a protocol, review manually.      Failed - Valid encounter within last 12 months    Recent Outpatient Visits           1 year ago Acquired hypothyroidism   [facility] [place], Dionne Bucy, MD   1 year ago Erroneous encounter - disregard   [facility] [place], Dionne Bucy, MD   1 year ago Acquired hypothyroidism   [facility] [place], Dionne Bucy, MD                Melatonin 10 MG TBCR [Pharmacy Med Name: MELATONIN ER 10 MG TAB] 15 tablet     Sig: TAKE 1 TABLET BY MOUTH EVERY NIGHT AT BEDTIME      Over the Counter:  OTC Failed - 12/04/2019  1:32 PM      Failed - Valid encounter within last 12 months    Recent Outpatient Visits           1 year ago Acquired hypothyroidism   [facility] [place], Dionne Bucy, MD  1 year ago Erroneous encounter - disregard   [facility] [place], Dionne Bucy, MD   1 year ago Acquired hypothyroidism   [facility] [place], Dionne Bucy, MD

## 2019-12-05 ENCOUNTER — Other Ambulatory Visit: Payer: Self-pay | Admitting: Family Medicine

## 2019-12-05 MED ORDER — LEVOTHYROXINE SODIUM 50 MCG PO TABS: 50.0000 ug | ORAL_TABLET | Freq: Every day | ORAL | 1 refills | Status: DC

## 2019-12-05 NOTE — Telephone Encounter (Signed)
Herbalist faxed refill request for the following medications:  levothyroxine (SYNTHROID) 50 MCG tablet   Please advise.  Thanks, [employer]

## 2019-12-05 NOTE — Telephone Encounter (Signed)
Thank you, noted.

## 2019-12-07 NOTE — Telephone Encounter (Signed)
Yes. Should still be on levothyroxine.  I think that is a comfort medication appropriate for hospice as symptoms of uncontrolled hypothyroidism can be unpleasant.

## 2019-12-07 NOTE — Telephone Encounter (Signed)
LMTCB 12/07/2019.  [facility] advise April (with Hospice) below.   Thanks,   -Mickel Baas

## 2019-12-07 NOTE — Telephone Encounter (Signed)
April would like to speak with the nurse about if the Pt should still be taking the levothyroxine (SYNTHROID) 50 MCG tablet  /please advise

## 2019-12-08 ENCOUNTER — Ambulatory Visit: Payer: Self-pay | Admitting: Family Medicine

## 2019-12-08 ENCOUNTER — Ambulatory Visit: Payer: Self-pay | Admitting: *Deleted

## 2019-12-08 NOTE — Telephone Encounter (Signed)
April, nurse with Hospice called in for the reply from Dr. Brita Romp.  I read her the message that yes the pt should still be on the levothyroxine.  "I think it is a comfort medication appropriate for hospice as symptoms of uncontrolled hypothyroidism can be unpleasant".  April wants to know if the pt will need a TSH drawn. They had a hard time getting blood from the pt with the last sample.  Pt is more contracted and unable to straighten her arms.  April is happy to try to get a sample but wanted the situation to be made aware of. April is fine with being called back and the message left on her voicemail.  I sent the message to Dr. Nancy Nordmann office.

## 2019-12-08 NOTE — Telephone Encounter (Signed)
Left detailed message for April, as below. [phone]

## 2019-12-08 NOTE — Telephone Encounter (Signed)
Last checked and normal in 12/20.  Ok to wait until at least 12/21 to recheck.

## 2019-12-08 NOTE — Telephone Encounter (Signed)
Attempted to contact April; left message on voicemail

## 2020-01-30 ENCOUNTER — Telehealth: Payer: Self-pay | Admitting: Family Medicine

## 2020-01-30 NOTE — Telephone Encounter (Signed)
Hospice, Christina [name] has called with contact # [phone]. They are requesting that pt needs a Tramadol script of 50 mg for every 6 hours for pain. They are requesting 15 day refills for total of 3 refills. Please contact Christina with any Questions as [name] pt [name], [name] Phone:  [phone]  Fax:  [phone]

## 2020-01-30 NOTE — Telephone Encounter (Signed)
Please review. Thanks!  

## 2020-02-01 NOTE — Telephone Encounter (Signed)
Can I ask the reason for the medicine? Dr. B has never prescribed this, so I would need to know the issue for documentation.

## 2020-02-01 NOTE — Telephone Encounter (Signed)
LMTCB 02/01/2020  Thanks,   -Mickel Baas

## 2020-03-08 ENCOUNTER — Ambulatory Visit: Payer: Self-pay | Admitting: *Deleted

## 2020-03-08 DIAGNOSIS — N3000 Acute cystitis without hematuria: Secondary | ICD-10-CM

## 2020-03-08 MED ORDER — AMOXICILLIN-POT CLAVULANATE 875-125 MG PO TABS: 1.0000 | ORAL_TABLET | Freq: Two times a day (BID) | ORAL | 0 refills | Status: DC

## 2020-03-08 NOTE — Telephone Encounter (Signed)
I cannot see the urinalysis or culture results.   I will send in Augmentin to treat empirically.  Hold Nitrofurantoin while on Augmentin and restart Nitrofurantoin once completed.

## 2020-03-08 NOTE — Telephone Encounter (Signed)
April, hospice nurse is calling to request the patient be prescribed an antibiotic to treat urine infection. April states that the patient had a uranalysis done on last week and abnormal results were faxed to the office. April states that the patient takes Nitrofuratonin daily as a preventative and the urinalysis showed that it was resistant to this medication. April wants to know if the patient should continue to take the Nitrofurantoin daily. April requesting to be called and to leave a voicemail when antibiotic is prescribed so that she can update the pt's medication list.  April reports that the patient has an allergy to sulfa antibiotics and that the patient uses [facility] for prescriptions. April, hospice nurse can be contacted at [phone].  Reason for Disposition  [1] Follow-up call from patient regarding patient's clinical status AND [2] information urgent  Protocols used: PCP CALL - NO TRIAGE-A-AH

## 2020-03-08 NOTE — Telephone Encounter (Signed)
Urine culture reviewed

## 2020-03-08 NOTE — Addendum Note (Signed)
Addended by: Mar Daring on: 03/08/2020 01:37 PM   Modules accepted: Orders

## 2020-03-13 ENCOUNTER — Telehealth: Payer: Self-pay | Admitting: Family Medicine

## 2020-03-13 DIAGNOSIS — Z23 Encounter for immunization: Secondary | ICD-10-CM

## 2020-03-13 NOTE — Telephone Encounter (Signed)
April advised as below.  Prescription sent to [facility].

## 2020-03-13 NOTE — Telephone Encounter (Signed)
April from [facility] called and stated Pts  need an order for a flu shot /  She also stated that the Pt had a UTI last week / pt was treated with Augmentin /April would like to know if the Pt should continue with taking the nitrofurantoin daily or discontinue once she is done with the Augmentin   Secure VM can be left

## 2020-03-13 NOTE — Telephone Encounter (Signed)
We can give verbal orders or send written Rx for flu shot.  Should hold Macrobid while on Augmentin and then resume after discontinuing.

## 2020-04-04 ENCOUNTER — Telehealth: Payer: Self-pay

## 2020-04-04 NOTE — Telephone Encounter (Signed)
Ok for verbals 

## 2020-04-04 NOTE — Telephone Encounter (Signed)
Copied from [place] [id number]. Topic: Quick Communication - Home Health Verbal Orders >> Apr 04, 2020 11:27 AM Gillis Ends D wrote: Caller/Agency: Hospice Callback Number: [phone] cell phone, can leave okay on voice mail (her name is April) Requesting OT/PT/Skilled Nursing/Social Work/Speech Therapy: Urinalysis and a culture Frequency n/a

## 2020-04-04 NOTE — Telephone Encounter (Signed)
Left message advising April.   Thanks,   -Mickel Baas

## 2020-04-15 ENCOUNTER — Telehealth: Payer: Self-pay

## 2020-04-15 ENCOUNTER — Telehealth: Payer: Self-pay | Admitting: Family Medicine

## 2020-04-15 MED ORDER — CEPHALEXIN 500 MG PO CAPS: 500.0000 mg | ORAL_CAPSULE | Freq: Three times a day (TID) | ORAL | 0 refills | Status: DC

## 2020-04-15 NOTE — Telephone Encounter (Signed)
Sent RX to [facility]  Left message advising April.   Thanks,   -Mickel Baas

## 2020-04-15 NOTE — Telephone Encounter (Signed)
Copied from [place] [id number]. Topic: General - Inquiry >> Apr 15, 2020 10:47 AM Gillis Ends D wrote: Reason for CRM: April from [facility] called and stated that patient has a confirmed UTI and request the doctor call in her an antibiotic. April can be reached at [phone]. Please advise

## 2020-04-15 NOTE — Telephone Encounter (Signed)
OK to send Keflex 500mg  TID x5d #15 r0. Thanks. Please let April know after it is sent

## 2020-05-09 ENCOUNTER — Other Ambulatory Visit: Payer: Self-pay | Admitting: Family Medicine

## 2020-05-09 MED ORDER — LEVOTHYROXINE SODIUM 50 MCG PO TABS: 50.0000 ug | ORAL_TABLET | Freq: Every day | ORAL | 0 refills | Status: DC

## 2020-05-09 NOTE — Telephone Encounter (Signed)
Medication Refill - Medication: levothyroxine (SYNTHROID) 50 MCG tablet     Preferred Pharmacy (with phone number or street name):  [facility] [place] Phone:  [phone]  Fax:  [phone]       Agent: Please be advised that RX refills may take up to 3 business days. We ask that you follow-up with your pharmacy.

## 2020-06-19 ENCOUNTER — Telehealth: Payer: Self-pay

## 2020-06-19 NOTE — Telephone Encounter (Signed)
Please advise.   UA SG 1.016 PH 6.0 Yellow Clear WBC 2+ Protein-negative Glucose-Negative Ketones-Negative Occult Blood-Negative Urobilinogen-0.2 Nitrite-Positive  Microscopic WBC 6-10 RBC-none Epithelial cells-0-10 Casts-none Crystals-Present [employer]

## 2020-06-19 NOTE — Telephone Encounter (Signed)
Copied from [place] [id number]. Topic: General - Other >> Jun 19, 2020 10:07 AM Jodie Echevaria wrote: Reason for CRM: Mitzi with [facility] called in to inform Dr B that patient urine test was ran and its not looking too good. 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 Mitzi would like a call back please at  Ph# [phone]

## 2020-06-20 MED ORDER — CEPHALEXIN 500 MG PO CAPS
500.0000 mg | ORAL_CAPSULE | Freq: Three times a day (TID) | ORAL | 0 refills | Status: DC
Start: 2020-06-20 — End: 2021-11-27

## 2020-06-20 NOTE — Addendum Note (Signed)
Addended by: Ashley Royalty E on: 06/20/2020 04:00 PM   Modules accepted: Orders

## 2020-06-20 NOTE — Telephone Encounter (Signed)
Mitzi from Hospice advised.  RX sent to [facility].   Thanks,   -Mickel Baas

## 2020-06-21 NOTE — Telephone Encounter (Signed)
Mitzi called and stated that the labs came back this morning and she wanted to know if Dr. B had taken a look at them to confirm that the patient was on the correct antibiotic or if she needed another one.  Please advise

## 2020-06-24 NOTE — Telephone Encounter (Signed)
I only saw prelim urine culture, no sensitivities. Will check box this morning, again.

## 2020-08-07 ENCOUNTER — Telehealth: Payer: Self-pay

## 2020-08-07 NOTE — Telephone Encounter (Signed)
[facility] faxed refill request for the following medications:  levothyroxine (SYNTHROID) 50 MCG tablet   Please advise.  

## 2020-08-08 MED ORDER — LEVOTHYROXINE SODIUM 50 MCG PO TABS: 50.0000 ug | ORAL_TABLET | Freq: Every day | ORAL | 1 refills | Status: DC

## 2020-08-20 ENCOUNTER — Other Ambulatory Visit: Payer: Self-pay | Admitting: Family Medicine

## 2020-08-20 NOTE — Telephone Encounter (Signed)
Requested medication (s) are due for refill today: no  Requested medication (s) are on the active medication list: no   Future visit scheduled:.no  Notes to clinic:  this refill cannot be delegated    Requested Prescriptions  Pending Prescriptions Disp Refills   ALPRAZolam (XANAX) 0.5 MG tablet [Pharmacy Med Name: ALPRAZOLAM 0.5 MG TAB] 90 tablet     Sig: TAKE 1 TABLET EVERY 4 TO 6 HOURS AS NEEDED      Not Delegated - Psychiatry:  Anxiolytics/Hypnotics Failed - 08/20/2020  8:49 AM      Failed - This refill cannot be delegated      Failed - Urine Drug Screen completed in last 360 days      Failed - Valid encounter within last 6 months    Recent Outpatient Visits           1 year ago Acquired hypothyroidism   [facility] [place], Dionne Bucy, MD   1 year ago Erroneous encounter - disregard   [facility] [place], Dionne Bucy, MD   2 years ago Acquired hypothyroidism   [facility] [place], Dionne Bucy, MD

## 2020-08-20 NOTE — Telephone Encounter (Signed)
LOV 10/12/18  Alprazolam not on med list

## 2020-09-18 ENCOUNTER — Other Ambulatory Visit: Payer: Self-pay | Admitting: Family Medicine

## 2020-09-18 NOTE — Telephone Encounter (Signed)
This patient has not been seen since 09/2018.  I did notice she is under Palliative home health care.  Would you still be the prescriber?

## 2020-09-18 NOTE — Telephone Encounter (Signed)
   Last refill: 08/13/2020  Future visit scheduled: no  Notes to clinic: overdue for follow up  Message has been sent to patient to contact office Review for courtesy refill   Requested Prescriptions  Pending Prescriptions Disp Refills   lisinopril (ZESTRIL) 10 MG tablet [Pharmacy Med Name: LISINOPRIL 10 MG TAB] 30 tablet     Sig: TAKE 1 TABLET BY MOUTH ONCE EVERY EVENING      Cardiovascular:  ACE Inhibitors Failed - 09/18/2020 12:42 PM      Failed - Cr in normal range and within 180 days    Creatinine, Ser  Date Value Ref Range Status  02/10/2013 0.80 0.50 - 1.10 mg/dL Final          Failed - K in normal range and within 180 days    Potassium  Date Value Ref Range Status  02/10/2013 4.3 3.5 - 5.1 mEq/L Final          Failed - Valid encounter within last 6 months    Recent Outpatient Visits           1 year ago Acquired hypothyroidism   [facility] [place], Dionne Bucy, MD   1 year ago Erroneous encounter - disregard   [facility] [place], Dionne Bucy, MD   2 years ago Acquired hypothyroidism   [facility] [place], Dionne Bucy, MD                Passed - Patient is not pregnant      Passed - Last BP in normal range    BP Readings from Last 1 Encounters:  03/21/19 (!) 108/58

## 2020-11-19 ENCOUNTER — Other Ambulatory Visit: Payer: Self-pay | Admitting: Family Medicine

## 2020-11-19 NOTE — Telephone Encounter (Signed)
Patient has not been seen since 10/12/2018

## 2020-11-19 NOTE — Telephone Encounter (Signed)
Requested medication (s) are due for refill today: no  Requested medication (s) are on the active medication list: yes  Last refill: 11/19/2020  Future visit scheduled: no  Notes to clinic:  Overdue for office visit  Appointment has not been scheduled    Requested Prescriptions  Pending Prescriptions Disp Refills   Melatonin 10 MG TBCR [Pharmacy Med Name: MELATONIN ER 10 MG TAB] 90 tablet 3    Sig: TAKE 1 TABLET BY MOUTH EVERY NIGHT AT BEDTIME      Over the Counter:  OTC Failed - 11/19/2020  9:20 AM      Failed - Valid encounter within last 12 months    Recent Outpatient Visits           2 years ago Acquired hypothyroidism   [facility] [place], Dionne Bucy, MD   2 years ago Erroneous encounter - disregard   [facility] [place], Dionne Bucy, MD   2 years ago Acquired hypothyroidism   [facility] [place], Dionne Bucy, MD

## 2021-01-31 ENCOUNTER — Other Ambulatory Visit: Payer: Self-pay

## 2021-01-31 NOTE — Telephone Encounter (Signed)
[name] husband advised that patient needs an office visit. He

## 2021-01-31 NOTE — Telephone Encounter (Signed)
[place] faxed refill request for the following medications:  levothyroxine (SYNTHROID) 50 MCG tablet  Please advise.

## 2021-02-03 MED ORDER — LEVOTHYROXINE SODIUM 50 MCG PO TABS: 50.0000 ug | ORAL_TABLET | Freq: Every day | ORAL | 3 refills | Status: DC

## 2021-02-03 NOTE — Telephone Encounter (Signed)
No need for appt. Ok to fill Synthroid for 90 day supply with 3 refills. Would still take it for quality of life.

## 2021-02-08 ENCOUNTER — Encounter: Payer: Self-pay | Admitting: Family Medicine

## 2021-02-09 ENCOUNTER — Encounter: Payer: Self-pay | Admitting: Family Medicine

## 2021-02-12 ENCOUNTER — Telehealth: Payer: Self-pay

## 2021-02-12 DIAGNOSIS — E039 Hypothyroidism, unspecified: Secondary | ICD-10-CM

## 2021-02-12 NOTE — Telephone Encounter (Signed)
Copied from [place] [id number]. Topic: Quick Communication - Home Health Verbal Orders >> Feb 12, 2021  1:11 PM P[place], Apolonio Schneiders wrote: Mitzi from hospice wanted to know if Dr B wanted to put in an order for a Thyriod panel, pt is now bed bound, caller stated they could do the panel and send Dr B the results, she would just need a verbal order to do so.

## 2021-02-12 NOTE — Telephone Encounter (Signed)
Yes please order a tsh

## 2021-02-13 ENCOUNTER — Other Ambulatory Visit: Payer: Self-pay

## 2021-02-14 NOTE — Telephone Encounter (Signed)
Eft message with hospice that they can do TSH on patient

## 2021-02-18 ENCOUNTER — Telehealth: Payer: Self-pay

## 2021-02-18 NOTE — Telephone Encounter (Signed)
Copied from [place] [id number]. Topic: General - Other >> Feb 17, 2021  9:29 AM Tessa Lerner A wrote: Reason for CRM: [name] husband would like to be contacted regarding upcoming blood work for the patient and coordinating with their insurance   The patients husband has concerns about how they'll be billed for the lab services and has had an issue previously   The patient's husband shares that a nurse will visit the patient to obtain the sample tomorrow morning and would like confirmation   Please contact further when possible

## 2021-02-18 NOTE — Telephone Encounter (Signed)
I'm not sure. Hospice was going to draw a TSH. Would check with hospice about that and whether they cover it

## 2021-02-19 NOTE — Telephone Encounter (Signed)
Advised patient's husband as below. He reports that it may be easier if you could order the TSH because he had to re-file it on his [employer]he[employer] insurance after receiving the bill. Is that something that you can do? He says if its too complicated dont worry about it. Please advise. Thanks!

## 2021-02-20 NOTE — Telephone Encounter (Signed)
I can, but only for drawing at a [facility]

## 2021-02-23 LAB — TSH: TSH: 1.45 (ref 0.41–5.90)

## 2021-03-10 ENCOUNTER — Encounter: Payer: Self-pay | Admitting: *Deleted

## 2021-03-13 ENCOUNTER — Ambulatory Visit: Payer: Self-pay | Admitting: Family Medicine

## 2021-04-22 ENCOUNTER — Other Ambulatory Visit: Payer: Self-pay | Admitting: Family Medicine

## 2021-04-24 ENCOUNTER — Telehealth: Payer: Self-pay | Admitting: Family Medicine

## 2021-04-24 NOTE — Telephone Encounter (Signed)
[facility] r[facility] is calling and faxed over the  urine and preliminary report she will refax and in the meantime would like pt to start on abx. For possible uti. Pt urine is very cloudy and has odor and she has dementia. Pt has not urinate in a few day . Pt has foley catheter now and is urinating. [place] pharm. Please call april

## 2021-04-24 NOTE — Telephone Encounter (Signed)
Please review. KW 

## 2021-04-25 NOTE — Telephone Encounter (Signed)
Lmtcb, contacted [facility] because our ofifce has not received final report, reports is being faxed over. Contacted April back to see if IV antibiotics could be administered left voicemail to return call. KW

## 2021-04-28 ENCOUNTER — Other Ambulatory Visit: Payer: Self-pay | Admitting: Family Medicine

## 2021-04-28 ENCOUNTER — Ambulatory Visit: Payer: Self-pay | Admitting: *Deleted

## 2021-04-28 DIAGNOSIS — N3001 Acute cystitis with hematuria: Secondary | ICD-10-CM | POA: Insufficient documentation

## 2021-04-28 MED ORDER — CIPROFLOXACIN HCL 750 MG PO TABS: 750.0000 mg | ORAL_TABLET | Freq: Two times a day (BID) | ORAL | 0 refills | Status: DC

## 2021-04-28 NOTE — Telephone Encounter (Signed)
Summary: Clinical Advice - Medication Managment   Caller would like to confirm the direction of ciprofloxacin (CIPRO) 750 MG tablet seeking clarity regarding when he should administer.   Caller stats patient is constipated which is not unusual for patient and caller would like to know if patient should take milk magnesium before she goes to bed at 7:30pm or start in the morning.      ciprofloxacin (CIPRO) 750 MG tablet 28 tablet 0 04/28/2021   Take 1 tablet (750 mg total) by mouth 2 (two) times daily. - Oral  Call to patient husband- advised if first dose of antibiotic late this afternoon- then next dose in am. He is good with that because he wanted to give MOM tonight to help bowels move.  Reason for Disposition  Caller has medicine question only, adult not sick, AND triager answers question  Answer Assessment - Initial Assessment Questions 1. NAME of MEDICATION: "What medicine are you calling about?"     Cipro dosing instructions 2. QUESTION: "What is your question?" (e.g., double dose of medicine, side effect)     If late afternoon dose- can next dose be in am 3. PRESCRIBING HCP: "Who prescribed it?" Reason: if prescribed by specialist, call should be referred to that group.     PCP 4. SYMPTOMS: "Do you have any symptoms?"     N/a  Protocols used: Medication Question Call-A-AH

## 2021-04-29 NOTE — Telephone Encounter (Signed)
Noted  

## 2021-06-10 ENCOUNTER — Telehealth: Payer: Self-pay | Admitting: Family Medicine

## 2021-06-10 NOTE — Telephone Encounter (Signed)
Mitzi, from [facility], calling and is requesting to have verbal orders to be able to send the pts medication refills in to the pharmacy. Please advise.       [phone]

## 2021-11-03 ENCOUNTER — Telehealth: Payer: Self-pay | Admitting: Family Medicine

## 2021-11-03 NOTE — Telephone Encounter (Signed)
Hospice Nurse April [facility]) called requesting palliative services for Hospice Care ? ?Best contact: [phone] ?

## 2021-11-04 NOTE — Telephone Encounter (Signed)
OK for verbals 

## 2021-11-05 NOTE — Telephone Encounter (Signed)
April advised of approved orders.  ?

## 2021-11-19 ENCOUNTER — Telehealth: Payer: Self-pay | Admitting: Nurse Practitioner

## 2021-11-19 NOTE — Telephone Encounter (Signed)
Rec'd call from patient's husband Christina [name], and we discussed the Palliative referral and services and he was in agreement with scheduling visit.  I have scheduled a MyChart Consult for 11/25/21 @ 2 PM. ?

## 2021-11-25 ENCOUNTER — Telehealth: Payer: PPO | Admitting: Nurse Practitioner

## 2021-11-25 DIAGNOSIS — K5900 Constipation, unspecified: Secondary | ICD-10-CM

## 2021-11-25 DIAGNOSIS — G308 Other Alzheimer's disease: Secondary | ICD-10-CM

## 2021-11-25 DIAGNOSIS — Z515 Encounter for palliative care: Secondary | ICD-10-CM

## 2021-11-25 DIAGNOSIS — R5381 Other malaise: Secondary | ICD-10-CM

## 2021-11-26 ENCOUNTER — Encounter: Payer: Self-pay | Admitting: Nurse Practitioner

## 2021-11-26 NOTE — Progress Notes (Signed)
? ? ?MyChart Video Visit ? ? ? ?Virtual Visit via Video Note  ? ?This visit type was conducted due to national recommendations for restrictions regarding the COVID-19 Pandemic (e.g. social distancing) in an effort to limit this patient's exposure and mitigate transmission in our community. This patient is at least at moderate risk for complications without adequate follow up. This format is felt to be most appropriate for this patient at this time. Physical exam was limited by quality of the video and audio technology used for the visit.  ? ? ?Patient location: home ?Provider location: [facility] ?Persons involved in the visit: patient, provider, caretaker, husband ? ?I discussed the limitations of evaluation and management by telemedicine and the availability of in person appointments. The patient expressed understanding and agreed to proceed. ? ?Patient: Christina [name]   DOB: 1951/03/18   71 y.o. Female  MRN: [id number] ?Visit Date: 11/27/2021 ? ?Today's healthcare provider: Lavon Paganini, MD  ? ?Chief Complaint  ?Patient presents with  ? Follow-up  ? Hypothyroidism  ? ?Subjective  ?  ?HPI  ?Hypothyroid, follow-up ? ?Lab Results  ?Component Value Date  ? TSH 1.45 02/23/2021  ? TSH 2.37 06/20/2019  ? TSH 7.55 (H) 12/09/2015  ? ? ?Wt Readings from Last 3 Encounters:  ?03/08/17 126 lb (57.2 kg)  ?09/07/16 122 lb (55.3 kg)  ?02/25/16 124 lb (56.2 kg)  ? ? ?She was last seen for hypothyroid 3 years ago.  ?She reports good compliance with treatment. ? ? ? ?-----------------------------------------------------------------------------------------  ? ?Hypertension, follow-up ? ?BP Readings from Last 3 Encounters:  ?03/21/19 (!) 108/58  ?02/20/19 (!) 100/58  ?01/19/19 (!) 100/58  ? Wt Readings from Last 3 Encounters:  ?03/08/17 126 lb (57.2 kg)  ?09/07/16 122 lb (55.3 kg)  ?02/25/16 124 lb (56.2 kg)  ?  ? ?She was last seen for hypertension 3 years ago ? ?She reports good compliance with treatment. ?She is  not having side effects.  ?She is following a Regular diet. ? ? ?Use of agents associated with hypertension: NSAIDS.  ? ? ?Pertinent labs ?No results found for: CHOL, HDL, LDLCALC, LDLDIRECT, TRIG, CHOLHDL Lab Results  ?Component Value Date  ? NA 133 (L) 02/10/2013  ? K 4.3 02/10/2013  ? CREATININE 0.80 02/10/2013  ? GFRNONAA 77 (L) 02/10/2013  ? GLUCOSE 85 02/10/2013  ? TSH 1.45 02/23/2021  ?  ? ?The ASCVD Risk score (Arnett DK, et al., 2019) failed to calculate for the following reasons: ?  The systolic blood pressure is missing ?  Cannot find a previous HDL lab ?  Cannot find a previous total cholesterol lab ? ?---------------------------------------------------------------------------------------------------  ? ?Follow up for Dementia: ? ?The patient was last seen for this 3 years ago. ?Changes made at last visit include none. ? ? ?Eating mostly pureed foods. No coughing/choking ? ?Using Xanax when she looks agitated or Tramadol prn for moaning and appearance of pain. ?-----------------------------------------------------------------------------------------  ? ? ?Medications: ?Outpatient Medications Prior to Visit  ?Medication Sig  ? acetaminophen (TYLENOL) 500 MG tablet Take 500 mg by mouth every 6 (six) hours as needed.  ? ALPRAZolam (XANAX) 0.25 MG tablet Take 0.5 mg by mouth every 4 (four) hours as needed for anxiety.  ? AMBULATORY NON FORMULARY MEDICATION [place] DX: G31.83  ? aspirin 81 MG tablet Take 81 mg by mouth daily.  ? ASPIRIN LOW DOSE 81 MG EC tablet TAKE 1 TABLET BY MOUTH EACH EVENING  ? fish oil-omega-3 fatty acids 1000 MG capsule Take  2 g by mouth daily.  ? levothyroxine (SYNTHROID) 50 MCG tablet Take 1 tablet (50 mcg total) by mouth daily. Please schedule office visit before any future refill.  ? lisinopril (ZESTRIL) 10 MG tablet TAKE 1 TABLET BY MOUTH ONCE EVERY EVENING  ? Melatonin 10 MG TBCR TAKE 1 TABLET BY MOUTH EVERY NIGHT AT BEDTIME  ? nitrofurantoin (MACRODANTIN) 100 MG capsule Take  100 mg by mouth daily.  ? oxybutynin (DITROPAN) 5 MG tablet Take 5 mg by mouth 3 (three) times daily as needed for bladder spasms.  ? traMADol (ULTRAM) 50 MG tablet Take 50 mg by mouth every 6 (six) hours as needed.  ? zinc oxide 20 % ointment Apply 1 application. topically as needed for irritation.  ? ALPRAZolam (XANAX) 0.5 MG tablet TAKE 1 TABLET EVERY 4 TO 6 HOURS AS NEEDED (Patient not taking: Reported on 11/27/2021)  ? calcium citrate-vitamin D (CITRACAL+D) 315-200 MG-UNIT tablet Take 1 tablet by mouth 2 (two) times daily. (Patient not taking: Reported on 11/27/2021)  ? cholecalciferol (VITAMIN D) 1000 units tablet Take 1,000 Units by mouth daily. (Patient not taking: Reported on 11/27/2021)  ? Multiple Vitamin (MULTIVITAMIN) tablet Take 1 tablet by mouth daily. (Patient not taking: Reported on 11/27/2021)  ? sertraline (ZOLOFT) 100 MG tablet Take 1 tablet (100 mg total) by mouth daily. (Patient not taking: Reported on 11/27/2021)  ? [DISCONTINUED] amoxicillin-clavulanate (AUGMENTIN) 875-125 MG tablet Take 1 tablet by mouth 2 (two) times daily.  ? [DISCONTINUED] cephALEXin (KEFLEX) 500 MG capsule Take 1 capsule (500 mg total) by mouth 3 (three) times daily.  ? [DISCONTINUED] ciprofloxacin (CIPRO) 750 MG tablet Take 1 tablet (750 mg total) by mouth 2 (two) times daily.  ? [DISCONTINUED] Melatonin 10 MG TABS Take by mouth.  ? [DISCONTINUED] nitrofurantoin, macrocrystal-monohydrate, (MACROBID) 100 MG capsule TAKE 1 CAPSULE BY MOUTH EVERY NIGHT AT BEDTIME  ? [DISCONTINUED] vitamin E 1000 UNIT capsule Take 1,000 Units by mouth daily. (Patient not taking: Reported on 11/27/2021)  ? ?No facility-administered medications prior to visit.  ? ? ?Review of Systems  ?Constitutional:  Negative for appetite change, chills, fatigue and fever.  ?Respiratory:  Negative for chest tightness and shortness of breath.   ?Cardiovascular:  Negative for chest pain and palpitations.  ?Gastrointestinal:  Negative for abdominal pain, nausea and  vomiting.  ?Neurological:  Negative for dizziness and weakness.  ? ? ? ? Objective  ?  ?There were no vitals taken for this visit. ? ? ? ? ?Physical Exam ?Constitutional:   ?   General: She is not in acute distress. ?   Appearance: She is underweight.  ?   Comments: Sitting in chair, nonverbal  ?HENT:  ?   Head: Normocephalic.  ?Pulmonary:  ?   Effort: Pulmonary effort is normal. No respiratory distress.  ?Neurological:  ?   Mental Status: She is alert.  ?  ? ? ? Assessment & Plan  ?  ? ?Problem List Items Addressed This Visit   ? ?  ? Cardiovascular and Mediastinum  ? Carotid artery disease ([place])  ?  Still on ASA 81 mg daily  ?Not on statin due to advanced dementia ?Discussed risks/benefits of ASA - husband/HCPOA agrees to d/c ? ?  ?  ? Relevant Medications  ? lisinopril (ZESTRIL) 10 MG tablet  ? Essential (primary) hypertension  ?  Well controlled on palliative care visit recently ?Continue lisinopril at current dose ?Recheck metabolic panel - will see if palliative care is able to have these drawn at  home. ? ?  ?  ? Relevant Medications  ? lisinopril (ZESTRIL) 10 MG tablet  ? Other Relevant Orders  ? Basic Metabolic Panel (BMET)  ?  ? Endocrine  ? Hypothyroidism - Primary  ?  Previously well controlled ?Continue Synthroid at current dose  ?Recheck TSH and adjust Synthroid as indicated   ?  ?  ? Relevant Medications  ? levothyroxine (SYNTHROID) 50 MCG tablet  ? Other Relevant Orders  ? TSH  ?  ? Nervous and Auditory  ? Dementia ([place])  ?  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 ?  ?  ? Relevant Medications  ? ALPRAZolam (XANAX) 0.25 MG tablet  ?  ? Other  ? Constipation  ?  Chronic and fairly well controlled ?Continue miralax daily and prn senakot ? ?  ?  ? Palliative  care patient  ?  ? ?No follow-ups on file.  ?  ? ?I discussed the assessment and treatment plan with the patient. The patient was provided an opportunity to ask questions and all were answered. The

## 2021-11-26 NOTE — Progress Notes (Signed)
? ? ?Manufacturing engineer ?[facility] Pall[facility] Consult Note ?Telephone: [phone]  ?Fax: [phone]  ? ? ?Date of encounter: 11/26/21 ?5:20 PM ?PATIENT NAME: Bunny Kleist Glahn ?[address] ?[place] [address]   ?[phone] (home)  ?DOB: 08-Aug-1950 ?MRN: [id number] ?PRIMARY CARE PROVIDER:    ?Virginia Crews, MD,  ?[address] ?[place] [address] ?[phone] ? ?REFERRING PROVIDER:   ?Virginia Crews, MD ?[address] ?[place],  [place] [address] ?[phone] ? ?RESPONSIBLE PARTY:    ?Contact Information   ? ? Name Relation Home Work Mobile  ? [place] Spouse [phone]  [phone]  ? ?  ? ?Due to the COVID-19 crisis, this visit was done via telemedicine from my office and it was initiated and consent by this patient and or family. ? ?I connected with Mr Ferrufino with  [facility] on 11/26/21 by a video enabled telemedicine application and verified that I am speaking with the correct person using two identifiers. ?  ?I discussed the limitations of evaluation and management by telemedicine. The patient expressed understanding and agreed to proceed. Palliative Care was asked to follow this patient by consultation request of  Bacigalupo, Dionne Bucy, MD to address advance care planning and complex medical decision making. This is a follow up visit.                                  ?ASSESSMENT AND PLAN / RECOMMENDATIONS:  ?Advance Care Planning/Goals of Care: Goals include to maximize quality of life and symptom management. Patient/health care surrogate gave his/her permission to discuss. ?Our advance care planning conversation included a discussion about:    ?The value and importance of advance care planning  ?Experiences with loved ones who have been seriously ill or have died  ?Exploration of personal, cultural or spiritual beliefs that might influence medical decisions  ?Exploration of goals of care in the event of a sudden injury or illness   ?Identification of a healthcare agent  ?Review and updating or creation of an  advance directive document . ?Decision not to resuscitate or to de-escalate disease focused treatments due to poor prognosis. ?CODE STATUS: DNR ? ?Symptom Management/Plan: ?1. Advance Care Planning;  DNR ? ?2. Debility secondary to dementia, recently d/c from Hospice due to stability, will continue to monitor, follow. Discussed progression of disease of dementia, realistic expectations. We talked about [facility] for in-home primary care. We talked about what changes would have to occur to re-visit Hospice services.  ? ?3. Constipation, discussed adding senokot 1 to 2 tablets qhs, discussed increasing oral hydration, nutrition, fiber. We talked about mirlax.  ? ?4. Goals of Care: Goals include to maximize quality of life and symptom management. Our advance care planning conversation included a discussion about:    ?The value and importance of advance care planning  ?Exploration of personal, cultural or spiritual beliefs that might influence medical decisions  ?Exploration of goals of care in the event of a sudden injury or illness  ?Identification and preparation of a healthcare agent  ?Review and updating or creation of an advance directive document. ? ?5. Palliative care encounter; Palliative care encounter; Palliative medicine team will continue to support patient, patient's family, and medical team. Visit consisted of counseling and education dealing with the complex and emotionally intense issues of symptom management and palliative care in the setting of serious and potentially life-threatening illness ?Follow  up Palliative Care Visit: Palliative care will continue to follow for complex medical decision making, advance care planning, and clarification of goals. Return 4 weeks or prn. ? ?I spent 61 minutes providing this consultation. More than 50% of the time in this consultation was spent in counseling and care  coordination. ?PPS: 30% ?Chief Complaint: Initial palliative consult for complex medical decision making ? ?HISTORY OF PRESENT ILLNESS:  KYRA LAFFEY is a 71 y.o. year old female  with multiple medical problems including Dementia, hypothyroidism, CAD, HTN, MDD. I connected with Mr Jiron for telemedicine PC initial visit. We talked about purpose of pc visit. Mr Kercheval in agreement. We talked about how Ms Mehlman has been doing. We talked about the last time Ms. Czajka was independent, PMH, social, family hx reviewed. We talked about family support. We talked about ros, symptoms, cognitive and functional abilities, total care, bed-bound. We talked about recent d/c from hospice services due to stability. We talked about constipation, appetite, measurements, nutrition, importance of hydration. We talked about role pc in poc. We talked about [facility] in poc for primary provider as Ms. Schou it is very difficult to get to her MD appointments. Will send referral. We talked about f/u PC visit, Mr Nicolaou in agreement, scheduled. Therapeutic listening, emotional support provided. Questions answered.  ? ?History obtained from review of EMR, discussion with Mr Weedman with Ms. Hilburn.  ?I reviewed available labs, medications, imaging, studies and related documents from the EMR.  Records reviewed and summarized above.  ? ?ROS ?10 point system reviewed all negative except HPI ? ?Physical Exam: ?deferred ? ?Thank you for the opportunity to participate in the care of Ms. Lillibridge.  The palliative care team will continue to follow. Please call our office at [phone] if we can be of additional assistance.  ? ?Tzirel Leonor Z Nikka Hakimian, NP   ?

## 2021-11-27 ENCOUNTER — Telehealth (INDEPENDENT_AMBULATORY_CARE_PROVIDER_SITE_OTHER): Payer: PPO | Admitting: Family Medicine

## 2021-11-27 DIAGNOSIS — Z515 Encounter for palliative care: Secondary | ICD-10-CM | POA: Diagnosis not present

## 2021-11-27 DIAGNOSIS — G308 Other Alzheimer's disease: Secondary | ICD-10-CM

## 2021-11-27 DIAGNOSIS — K5900 Constipation, unspecified: Secondary | ICD-10-CM | POA: Diagnosis not present

## 2021-11-27 DIAGNOSIS — I10 Essential (primary) hypertension: Secondary | ICD-10-CM

## 2021-11-27 DIAGNOSIS — E039 Hypothyroidism, unspecified: Secondary | ICD-10-CM

## 2021-11-27 DIAGNOSIS — F02818 Dementia in other diseases classified elsewhere, unspecified severity, with other behavioral disturbance: Secondary | ICD-10-CM

## 2021-11-27 DIAGNOSIS — I779 Disorder of arteries and arterioles, unspecified: Secondary | ICD-10-CM

## 2021-11-27 MED ORDER — ALPRAZOLAM 0.25 MG PO TABS: 0.5000 mg | ORAL_TABLET | ORAL | 3 refills | Status: DC | PRN

## 2021-11-27 MED ORDER — LEVOTHYROXINE SODIUM 50 MCG PO TABS: 50.0000 ug | ORAL_TABLET | Freq: Every day | ORAL | 3 refills | Status: DC

## 2021-11-27 MED ORDER — NITROFURANTOIN MACROCRYSTAL 100 MG PO CAPS: 100.0000 mg | ORAL_CAPSULE | Freq: Every day | ORAL | 3 refills | Status: DC

## 2021-11-27 MED ORDER — LISINOPRIL 10 MG PO TABS: 10.0000 mg | ORAL_TABLET | Freq: Every day | ORAL | 3 refills | Status: DC

## 2021-11-27 MED ORDER — TRAMADOL HCL 50 MG PO TABS: 50.0000 mg | ORAL_TABLET | Freq: Four times a day (QID) | ORAL | 3 refills | Status: DC | PRN

## 2021-11-27 NOTE — Assessment & Plan Note (Signed)
Chronic and fairly well controlled ?Continue miralax daily and prn senakot ?

## 2021-11-27 NOTE — Assessment & Plan Note (Signed)
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

## 2021-11-27 NOTE — Assessment & Plan Note (Signed)
Well controlled on palliative care visit recently ?Continue lisinopril at current dose ?Recheck metabolic panel - will see if palliative care is able to have these drawn at home. ?

## 2021-11-27 NOTE — Assessment & Plan Note (Signed)
Still on ASA 81 mg daily  ?Not on statin due to advanced dementia ?Discussed risks/benefits of ASA - husband/HCPOA agrees to d/c ?

## 2021-11-27 NOTE — Assessment & Plan Note (Signed)
Previously well controlled Continue Synthroid at current dose  Recheck TSH and adjust Synthroid as indicated   

## 2021-12-04 DIAGNOSIS — F419 Anxiety disorder, unspecified: Secondary | ICD-10-CM | POA: Diagnosis not present

## 2021-12-04 DIAGNOSIS — Z7401 Bed confinement status: Secondary | ICD-10-CM | POA: Diagnosis not present

## 2021-12-04 DIAGNOSIS — N390 Urinary tract infection, site not specified: Secondary | ICD-10-CM | POA: Diagnosis not present

## 2021-12-04 DIAGNOSIS — Z741 Need for assistance with personal care: Secondary | ICD-10-CM | POA: Diagnosis not present

## 2021-12-04 DIAGNOSIS — M1990 Unspecified osteoarthritis, unspecified site: Secondary | ICD-10-CM | POA: Diagnosis not present

## 2021-12-04 DIAGNOSIS — E039 Hypothyroidism, unspecified: Secondary | ICD-10-CM | POA: Diagnosis not present

## 2021-12-09 ENCOUNTER — Encounter: Payer: Self-pay | Admitting: Nurse Practitioner

## 2021-12-09 ENCOUNTER — Other Ambulatory Visit: Payer: PPO | Admitting: Nurse Practitioner

## 2021-12-09 DIAGNOSIS — K5900 Constipation, unspecified: Secondary | ICD-10-CM

## 2021-12-09 DIAGNOSIS — F02818 Dementia in other diseases classified elsewhere, unspecified severity, with other behavioral disturbance: Secondary | ICD-10-CM

## 2021-12-09 DIAGNOSIS — Z515 Encounter for palliative care: Secondary | ICD-10-CM

## 2021-12-09 DIAGNOSIS — R5381 Other malaise: Secondary | ICD-10-CM

## 2021-12-09 NOTE — Progress Notes (Signed)
[facility] Palliative [facility] Consult Note Telephone: [phone]  Fax: [phone]    Date of encounter: 12/09/21 1:02 PM PATIENT NAME: Christina [name] [name] [address]   [phone] (home)  DOB: [birth year] MRN: [id number] PRIMARY CARE PROVIDER:   [facility] Virginia Crews, MD,  [address] [place] [address] [phone] RESPONSIBLE PARTY:    Contact Information     Name Relation Home Work Mobile   Schwoerer,Gene Spouse [phone]  [phone]      I met face to face with patient and family in home. Palliative Care was asked to follow this patient by consultation request of  [facility] to address advance care planning and complex medical decision making. This is a follow up visit.                                  ASSESSMENT AND PLAN / RECOMMENDATIONS:  Symptom Management/Plan: 1. Advance Care Planning;  DNR   2. Debility secondary to dementia, recently d/c from Hospice due to stability, will continue to monitor, follow. Discussed progression of disease of dementia, realistic expectations. We talked about [facility] for in-home primary care. We talked about what changes would have to occur to re-visit Hospice services.    3. Constipation, discussed adding senokot 1 to 2 tablets qhs, discussed increasing oral hydration, nutrition, fiber. We talked about mirlax.    4. Palliative care encounter; Palliative care encounter; Palliative medicine team will continue to support patient, patient's family, and medical team. Visit consisted of counseling and education dealing with the complex and emotionally intense issues of symptom management and palliative care in the setting of serious and potentially life-threatening illness   Follow up Palliative Care Visit: Palliative care will continue to follow for complex medical decision making, advance care planning, and clarification of goals. Return 8 weeks or prn. I  spent 62 minutes providing this consultation. More than 50% of the time in this consultation was spent in counseling and care coordination. PPS: 30% Chief Complaint: Follow up palliative consult for complex medical decision making  HISTORY OF PRESENT ILLNESS:  Christina [name] is a 71 y.o. year old female with multiple medical problems including Dementia, hypothyroidism, CAD, HTN, MDD. I called Mr Peaden to confirm in person pc visit, in agreement. I visited and observed [name] in her home, sitting in recliner in living room with daughter [name] and husband Mr. Huot present. We talked about purpose of pc visit. Mr Hedstrom in agreement. We talked about how Ms Varano has been doing. We talked about ros, symptoms, cognitive and functional abilities, total care, bed-bound. We talked about recent d/c from hospice services due to stability, challenges when having a lot of help then when stable has to be discontinued. We talked about constipation, appetite, measurements, nutrition, importance of hydration. We talked about role pc in poc. We talked about [facility] did make an initial visit, went well. We talked about services of pc and poc. We talked about Mr. Redford building a house near [name]'s daughter [name] and will eventually need to relocate. Mr Dallaire talked about respite care. Will have PC SW contact to further discuss respite care and PC RN to visit in 4 weeks then NP in 8 weeks for follow up. Mr. Kempton in agreement, scheduled. Mr Battle talked about disease progression of dementia, family history, historical events.  Therapeutic  listening, emotional support provided. Questions answered.    2. Debility secondary to dementia, recently d/c from Hospice due to stability, will continue to monitor, follow. Discussed progression of disease of dementia, realistic expectations. We talked about [facility] for in-home primary care. We talked about what changes would have to occur to re-visit Hospice services.    3.  Constipation, discussed adding senokot 1 to 2 tablets qhs, discussed increasing oral hydration, nutrition, fiber. We talked about mirlax.    4. Palliative care encounter; Palliative care encounter; Palliative medicine team will continue to support patient, patient's family, and medical team. Visit consisted of counseling and education dealing with the complex and emotionally intense issues of symptom management and palliative care in the setting of serious and potentially life-threatening illness  History obtained from review of EMR, discussion with primary team, and interview with family, facility staff/caregiver and/or [name].  I reviewed available labs, medications, imaging, studies and related documents from the EMR.  Records reviewed and summarized above.   ROS 10 point system revi  Physical Exam: Constitutional: NAD General: frail appearing, thin, severely cognitively impaired female, bed-bound EYES: lids intact ENMT: oral mucous membranes moist CV: S1S2, RRR Pulmonary: LCTA, no increased work of breathing, no cough, room air Abdomen: normo-active BS + 4 quadrants, soft and non tender MSK: functionally quadriplegic; +muscle wasting Skin: warm and dry Neuro:  + generalized weakness,  + cognitive impairment Psych: flat affect, confused Thank you for the opportunity to participate in the care of [name].  The palliative care team will continue to follow. Please call our office at [phone] if we can be of additional assistance.   Kelie Gainey Z Zaineb Nowaczyk, NP   COVID-19 PATIENT SCREENING TOOL Asked and negative response unless otherwise noted:   Have you had symptoms of covid, tested positive or been in contact with someone with symptoms/positive test in the past 5-10 days? NO

## 2021-12-11 ENCOUNTER — Other Ambulatory Visit: Payer: PPO

## 2021-12-11 DIAGNOSIS — Z789 Other specified health status: Secondary | ICD-10-CM

## 2021-12-11 DIAGNOSIS — G3109 Other frontotemporal dementia: Secondary | ICD-10-CM | POA: Diagnosis not present

## 2021-12-11 DIAGNOSIS — F0390 Unspecified dementia without behavioral disturbance: Secondary | ICD-10-CM | POA: Diagnosis not present

## 2021-12-11 DIAGNOSIS — E785 Hyperlipidemia, unspecified: Secondary | ICD-10-CM | POA: Diagnosis not present

## 2021-12-11 DIAGNOSIS — I10 Essential (primary) hypertension: Secondary | ICD-10-CM | POA: Diagnosis not present

## 2021-12-11 DIAGNOSIS — E039 Hypothyroidism, unspecified: Secondary | ICD-10-CM | POA: Diagnosis not present

## 2021-12-11 NOTE — Progress Notes (Signed)
Palliative care SW outreached patient to complete telephonic visit.     Spouse endorses that patient is total care and bed bound, utilizing a hoyer lift for transport.   Hospitalizations: none   Psychosocial assessment: completed.  Respite: Spouse shared that he is interested in respite stay options for patient sometime next month, as they have built a home and will be moving to [place], [place]. Spouse shared that while patient was on hospice he was told that she may be able to receive respite stay at the hospice during the move, however patient is no longer receiving hospice services. Spouse would like to consider facilities in [facility] for short term stay. SW advised spouse that most facilities provide all ADL care assistance and meals with the room and board cost, family would need to provide medications and transportation.  Patient is only appropriate for SNF respite stay as she is total care and bed bound.   SW will outreach the following facilities about their respite stay policy and provide spouse with their feedback so he can make a decision:  [facility] - admissions [email or web address]  $280/day and currently have availability.  [facility] - admissions LVM.  [facility] -  admissions  [email or web address]  $280/day for 7 days and currently have availability.  [facility] [phone] $280/day. Currently have waiting list, may have beds available in June.   No other psychosocial needs. No S/S of depression or anxiety noted.     Palliative care will continue to monitor and assist with long term care planning as needed.

## 2021-12-31 DIAGNOSIS — R197 Diarrhea, unspecified: Secondary | ICD-10-CM | POA: Diagnosis not present

## 2021-12-31 DIAGNOSIS — R54 Age-related physical debility: Secondary | ICD-10-CM | POA: Diagnosis not present

## 2021-12-31 DIAGNOSIS — N390 Urinary tract infection, site not specified: Secondary | ICD-10-CM | POA: Diagnosis not present

## 2021-12-31 DIAGNOSIS — F0390 Unspecified dementia without behavioral disturbance: Secondary | ICD-10-CM | POA: Diagnosis not present

## 2022-01-29 DIAGNOSIS — K582 Mixed irritable bowel syndrome: Secondary | ICD-10-CM | POA: Diagnosis not present

## 2022-01-29 DIAGNOSIS — F0390 Unspecified dementia without behavioral disturbance: Secondary | ICD-10-CM | POA: Diagnosis not present

## 2022-01-29 DIAGNOSIS — R54 Age-related physical debility: Secondary | ICD-10-CM | POA: Diagnosis not present

## 2022-01-29 DIAGNOSIS — N390 Urinary tract infection, site not specified: Secondary | ICD-10-CM | POA: Diagnosis not present

## 2022-02-09 ENCOUNTER — Telehealth: Payer: Self-pay

## 2022-02-09 ENCOUNTER — Other Ambulatory Visit: Payer: PPO

## 2022-02-09 DIAGNOSIS — Z515 Encounter for palliative care: Secondary | ICD-10-CM

## 2022-02-09 NOTE — Progress Notes (Signed)
PATIENT NAME: Christina [name] DOB: 02-Mar-1951 MRN: [id number]  PRIMARY CARE PROVIDER: Virginia Crews, MD  RESPONSIBLE PARTY:  Acct ID - Guarantor Home Phone Work Phone Relationship Acct Type  [id number] NATACIA, [phone]  Self P/F     [place], [place], [place] [address]    I connected with  Verdia Bolt Nijjar on 02/09/22 by telephone and verified that I am speaking with the correct person using two identifiers.   I discussed the limitations of evaluation and management by telemedicine. The patient expressed understanding and agreed to proceed.   Incoming call from spouse Gene Pembleton to provide an update on patient status.  Dementia:  patient is bed-bound and total assist with ADL's.  Unable to communicate needs.  Incontinent of bowel and bladder.  Patient po intake has been very good with 3 meals a day.  Despite this, spouse describes patient and "skin and bones".   No issues with sleeping reported.   Constipation:  Patient is having regular bowel movements that fluctuate between loose and formed.   Continues with bowel regimen that is adjusted as needed.   Move:  Spouse shares their home in [place] is almost complete but they are not in a hurry to move.  He is able to remain in his current home until January and will work on moving slowly once their home is finished.  Spouse advised patient's daughter and granddaughter are located in [place] so they will have more support.  The granddaughter has already advised of a PCP who could take patient once they move.  We discussed continuation of Palliative Care services or hospice care.  Spouse is open to having [facility] involved with patient once they move.  We discussed the process of referral once patient does move.  Update provided to Christin Gusler, NP.   HISTORY OF PRESENT ILLNESS:  71 year old female with Dementia.  Patient is followed by Palliative Care every 4-8 weeks and PRN.   CODE  STATUS: DNR ADVANCED DIRECTIVES: No MOST FORM: No PPS: 30%        Lorenza Burton, RN

## 2022-02-09 NOTE — Telephone Encounter (Signed)
250 pm.  Phone call made to spouse to follow up on patient status and location.  No answer.  Message left requesting a call back.

## 2022-03-05 ENCOUNTER — Other Ambulatory Visit: Payer: PPO | Admitting: Nurse Practitioner

## 2022-03-05 ENCOUNTER — Encounter: Payer: Self-pay | Admitting: Nurse Practitioner

## 2022-03-05 DIAGNOSIS — Z515 Encounter for palliative care: Secondary | ICD-10-CM

## 2022-03-05 DIAGNOSIS — R5381 Other malaise: Secondary | ICD-10-CM

## 2022-03-05 DIAGNOSIS — K5900 Constipation, unspecified: Secondary | ICD-10-CM

## 2022-03-05 DIAGNOSIS — F02818 Dementia in other diseases classified elsewhere, unspecified severity, with other behavioral disturbance: Secondary | ICD-10-CM

## 2022-03-05 DIAGNOSIS — G308 Other Alzheimer's disease: Secondary | ICD-10-CM | POA: Diagnosis not present

## 2022-03-05 NOTE — Progress Notes (Signed)
[facility] Consult Note Telephone: [phone]  Fax: [phone]    Date of encounter: 03/05/22 1:08 PM PATIENT NAME: Christina [name] [place] [address]   [phone] (home)  DOB: 11-02-1950 MRN: [id number] PRIMARY CARE PROVIDER:    Virginia Crews, MD,  [address] [place] [address] [phone]  RESPONSIBLE PARTY:    Contact Information     Name Relation Home Work Mobile   Christina [name] Spouse [phone]  [phone]      Due to the COVID-19 crisis, this visit was done via telemedicine from my office and it was initiated and consent by this patient and or family.  I connected with Christina [name] with  Christina [name] OR [facility] on 03/05/22 by telephone as video not available enabled telemedicine application and verified that I am speaking with the correct person.   I discussed the limitations of evaluation and management by telemedicine. The patient expressed understanding and agreed to proceed. Palliative Care was asked to follow this patient by consultation request of  Christina [name], Christina Bucy, MD to address advance care planning and complex medical decision making. This is a follow up visit. ASSESSMENT AND PLAN / RECOMMENDATIONS:  Symptom Management/Plan: 1. Advance Care Planning;  DNR   2. Debility secondary to dementia, d/c from Hospice due to stability, will continue to monitor, follow. Discussed progression of disease of dementia, realistic expectations. We talked about what changes would have to occur to re-visit Hospice services when they move toward the end of September if she declines, otherwise will request from new primary care provider.   3. Constipation, improved, discussed adding senokot 1 to 2 tablets qhs, discussed increasing oral hydration, nutrition, fiber. We talked about mirlax.    4. Palliative care encounter; Palliative care encounter; Palliative medicine team will continue to  support patient, patient's family, and medical team. Visit consisted of counseling and education dealing with the complex and emotionally intense issues of symptom management and palliative care in the setting of serious and potentially life-threatening illness   Follow up Palliative Care Visit: Palliative care will continue to follow for complex medical decision making, advance care planning, and clarification of goals. Return 8 weeks or prn. I spent 32 minutes providing this consultation. More than 50% of the time in this consultation was spent in counseling and care coordination. PPS: 30% Chief Complaint: Follow up palliative consult for complex medical decision making   HISTORY OF PRESENT ILLNESS:  Christina [name] is a 71 y.o. year old female with multiple medical problems including Dementia, hypothyroidism, CAD, HTN, MDD. I called Christina [name] to for telephonic, telemedicine f/u pc visit. We talked about purpose of pc visit. Christina [name] in agreement. We talked about how Christina [name] has been doing. We talked about ros, symptoms, cognitive and functional abilities, total care, bed-bound. We talked about constipation, appetite, measurements, nutrition, importance of hydration. We talked about role pc in poc. We talked about Christina. [name] house is complete and will move Christina [name]'s the end of the month of September, establish with new provider then order hospice should Christina [name] decline or for new hospice evaluation. We talked about ros, appetite, no new functional changes, continues to sleep a lot. Christina [name] talked about disease progression of dementia. Christina [name] was going to contact Christina [name] when move has been completed. Christina [name] at present time remains stable. Therapeutic listening, emotional support provided. Questions answered.    2. Debility secondary to  dementia, progressive will continue to monitor, follow. Discussed progression of disease of dementia, realistic expectations.    3. Constipation, improved, discussed  adding senokot 1 to 2 tablets qhs, discussed increasing oral hydration, nutrition, fiber. We talked about mirlax.    4. Palliative care encounter; Palliative care encounter; Palliative medicine team will continue to support patient, patient's family, and medical team. Visit consisted of counseling and education dealing with the complex and emotionally intense issues of symptom management and palliative care in the setting of serious and potentially life-threatening illness   History obtained from review of EMR, discussion with primary team, and interview with family, facility staff/caregiver and/or Christina. Kington.  I reviewed available labs, medications, imaging, studies and related documents from the EMR.  Records reviewed and summarized above.    ROS 10 point system revi   Physical Exam: Thank you for the opportunity to participate in the care of Christina [name].  The palliative care team will continue to follow. Please call our office at [phone] if we can be of additional assistance.   Christina Klabunde Ihor Gully, NP

## 2022-03-11 ENCOUNTER — Telehealth: Payer: Self-pay

## 2022-03-11 NOTE — Telephone Encounter (Signed)
445 pm.  Message received from [facility] requesting a call back.  Gene states patient has not had a good bm in about 3 days.  Intake remains the same and they are using miralax.  Senekot was given last night without effect. Spouse administered a suppository this afternoon around 2 pm.  He noted insertion of the suppository was easy and not hard stool was felt.  He believes stool is soft but patient is unable to expel stool.   Spouse will wait and see if suppository is effective.  If no results by tomorrow, spouse will administer an enema.

## 2022-03-13 ENCOUNTER — Telehealth: Payer: Self-pay

## 2022-03-13 NOTE — Telephone Encounter (Signed)
1020 am.  Follow up call made to spouse Gene to check on bowel status of patient.  Spouse is unable to talk at this time but will call back.

## 2022-03-16 DIAGNOSIS — G3109 Other frontotemporal dementia: Secondary | ICD-10-CM | POA: Diagnosis not present

## 2022-03-16 DIAGNOSIS — N390 Urinary tract infection, site not specified: Secondary | ICD-10-CM | POA: Diagnosis not present

## 2022-03-16 DIAGNOSIS — R532 Functional quadriplegia: Secondary | ICD-10-CM | POA: Diagnosis not present

## 2022-03-16 DIAGNOSIS — F0390 Unspecified dementia without behavioral disturbance: Secondary | ICD-10-CM | POA: Diagnosis not present

## 2022-06-03 ENCOUNTER — Telehealth: Payer: Self-pay

## 2022-06-03 NOTE — Telephone Encounter (Signed)
5 pm.  Phone call made to patient's spouse to follow up on patient.  Spouse Gene advised they have moved to [facility] and patient is now under hospice.  Advised patient would be discharged from Palliative Care services.

## 2022-06-19 DEATH — deceased
